# Patient Record
Sex: Female | Born: 2000 | Race: Asian | Hispanic: No | Marital: Single | Smoking: Never smoker
Health system: Southern US, Community
[De-identification: ages and names within clinical notes are randomized; demographics above are authoritative.]

## PROBLEM LIST (undated history)

## (undated) DIAGNOSIS — N200 Calculus of kidney: Secondary | ICD-10-CM

---
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
## 2021-01-03 ENCOUNTER — Telehealth: Payer: Self-pay

## 2021-01-03 ENCOUNTER — Other Ambulatory Visit: Payer: Self-pay | Admitting: Obstetrics and Gynecology

## 2021-01-03 DIAGNOSIS — R7989 Other specified abnormal findings of blood chemistry: Secondary | ICD-10-CM

## 2021-01-03 NOTE — Telephone Encounter (Addendum)
-----   Message from [place] Bing, MD sent at 01/03/2021 12:32 PM EST ----- Can y'all contact her and let her know that it looks like the lab couldn't add on a free t4 to her tsh and ask her to come in for a re-draw? I've put in the orders. Thanks  Called pt with [employer] # and left message requesting for pt to come in for a lab draw if she could please give the office a call.  Pt lab drawn on 01/05/21.  Leonette Nutting  01/03/21

## 2021-01-05 ENCOUNTER — Other Ambulatory Visit: Payer: Self-pay

## 2021-01-05 ENCOUNTER — Other Ambulatory Visit: Payer: Medicaid Other

## 2021-01-05 DIAGNOSIS — R7989 Other specified abnormal findings of blood chemistry: Secondary | ICD-10-CM

## 2021-01-06 LAB — T4, FREE: Free T4: 1.18 ng/dL (ref 0.93–1.60)

## 2021-01-06 LAB — TSH: TSH: 2.84 u[IU]/mL (ref 0.450–4.500)

## 2021-01-23 DIAGNOSIS — Z419 Encounter for procedure for purposes other than remedying health state, unspecified: Secondary | ICD-10-CM | POA: Diagnosis not present

## 2021-02-23 DIAGNOSIS — Z419 Encounter for procedure for purposes other than remedying health state, unspecified: Secondary | ICD-10-CM | POA: Diagnosis not present

## 2021-03-17 ENCOUNTER — Ambulatory Visit (INDEPENDENT_AMBULATORY_CARE_PROVIDER_SITE_OTHER): Payer: Medicaid Other | Admitting: Internal Medicine

## 2021-03-17 ENCOUNTER — Encounter: Payer: Self-pay | Admitting: Internal Medicine

## 2021-03-17 VITALS — BP 111/68 | HR 87 | Temp 98.1°F | Wt 163.1 lb

## 2021-03-17 DIAGNOSIS — R1013 Epigastric pain: Secondary | ICD-10-CM | POA: Diagnosis not present

## 2021-03-17 HISTORY — DX: Epigastric pain: R10.13

## 2021-03-17 NOTE — Progress Notes (Signed)
° °  CC: abdominal pain  HPI:  [name] is a 21 y.o. female with PMHx as stated below presenting for evaluation of abdominal pain. Please see problem based charting for complete assessment and plan.   Past Medical History:  Diagnosis Date   COVID-19 08/2020   Review of Systems:  Negative except as stated in HPI  Physical Exam:  Vitals:   03/17/21 1502  BP: 111/68  Pulse: 87  Temp: 98.1 F (36.7 C)  TempSrc: Oral  SpO2: 99%  Weight: 163 lb 2.3 oz (74 kg)   Physical Exam  Constitutional: Appears well-developed and well-nourished. No distress.  HENT: Normocephalic and atraumatic, EOMI, conjunctiva normal, moist mucous membranes GI: Nondistended, soft, nontender to palpation, normal bowel sounds Musculoskeletal: Normal bulk and tone.   Skin: Warm and dry.  No rash, erythema, lesions noted. Psychiatric: Normal mood and affect. Behavior is normal. Judgment and thought content normal.   Assessment & Plan:   See Encounters Tab for problem based charting.  Patient discussed with Dr. Oswaldo Done

## 2021-03-17 NOTE — Patient Instructions (Addendum)
Ms Pamela Buckley,  It was a pleasure seeing you in clinic. Today we discussed:   Dyspepsia:  We are testing for H. Pylori at this time. I will call you with any abnormal results and further recommendations.   Irregular menses: Follow up with the OB/GYN at [facility] for [facility] [phone]).   If you have any questions or concerns, please call our clinic at [phone] between 9am-5pm and after hours call [phone] and ask for the internal medicine resident on call. If you feel you are having a medical emergency please call 911.   Thank you, we look forward to helping you remain healthy!

## 2021-03-17 NOTE — Assessment & Plan Note (Addendum)
Patient is presenting with two months of diffuse cramping abdominal pain that has been persistent every morning on awakening. She reports associated "bad taste" in her mouth as well. This is relieved with defecation. She denies any diarrhea or constipation and reports that her stools are non-melanotic and without hematochezia. She has had intermittent nausea but no vomiting. Reports that her sister and cousin have had similar symptoms. She has a history of H pylori infection that was treated with triple therapy in 2022 with improvement in symptoms and repeat H.pylori was negative.  Plan: Repeat H. Pylori testing If negative, can continue with supportive care for dsyspepsia   ADDENDUM: H.pylori negative. Discussed with patient who still reports ongoing symptoms. Will trial 8 week of omeprazole 20mg  daily. If no significant improvement noted, will pursue further work up

## 2021-03-18 NOTE — Progress Notes (Signed)
Internal Medicine Clinic Attending ° °Case discussed with Dr. Aslam  At the time of the visit.  We reviewed the resident’s history and exam and pertinent patient test results.  I agree with the assessment, diagnosis, and plan of care documented in the resident’s note.  °

## 2021-03-19 LAB — H. PYLORI ANTIGEN, STOOL: H pylori Ag, Stl: NEGATIVE

## 2021-03-21 ENCOUNTER — Telehealth: Payer: Self-pay

## 2021-03-21 MED ORDER — OMEPRAZOLE MAGNESIUM 20 MG PO TBEC: 20.0000 mg | DELAYED_RELEASE_TABLET | Freq: Every day | ORAL | 0 refills | Status: DC

## 2021-03-21 NOTE — Telephone Encounter (Signed)
PA  for pt ( OMEPRAZOLE 20 MG DR TAB ) came through on cover my meds was submitted with office notes and labs .. awaiting approval or denial

## 2021-03-21 NOTE — Addendum Note (Signed)
Addended by: Eliezer Bottom on: 03/21/2021 09:12 AM   Modules accepted: Orders

## 2021-03-21 NOTE — Telephone Encounter (Signed)
DECISION :     Denied today    Denied. The drug that you asked for is not covered.   We are denying your request because we do not show that you have tried at least 2 preferred drugs.   Other covered drugs are: esomeprazole magnesium capsule (generic for Nexium Rx), esomeprazole magnesium tablet OTC (generic for Nexium OTC), lansoprazole capsule (generic for Prevacid Rx), Nexium Rx Packet, omeprazole Rx capsule (generic for Prilosec Rx), pantoprazole tablet (generic for Protonix), Protonix Suspension. Note: Some covered drug(s) may have limits on the quantity you can get. You can get this information on the list of covered drugs (Preferred Drug List) for details.    ( COPY PLACED TO BE SCANNED TO CHART AND DR. NOTIFIED )

## 2021-03-23 DIAGNOSIS — Z419 Encounter for procedure for purposes other than remedying health state, unspecified: Secondary | ICD-10-CM | POA: Diagnosis not present

## 2021-04-23 DIAGNOSIS — Z419 Encounter for procedure for purposes other than remedying health state, unspecified: Secondary | ICD-10-CM | POA: Diagnosis not present

## 2021-05-16 DIAGNOSIS — M25562 Pain in left knee: Secondary | ICD-10-CM | POA: Diagnosis not present

## 2021-05-16 DIAGNOSIS — S8002XA Contusion of left knee, initial encounter: Secondary | ICD-10-CM | POA: Diagnosis not present

## 2021-05-23 DIAGNOSIS — Z419 Encounter for procedure for purposes other than remedying health state, unspecified: Secondary | ICD-10-CM | POA: Diagnosis not present

## 2021-06-02 DIAGNOSIS — L209 Atopic dermatitis, unspecified: Secondary | ICD-10-CM | POA: Diagnosis not present

## 2021-06-10 ENCOUNTER — Encounter: Payer: Self-pay | Admitting: Obstetrics and Gynecology

## 2021-06-10 ENCOUNTER — Encounter: Payer: Self-pay | Admitting: Family Medicine

## 2021-06-23 DIAGNOSIS — Z419 Encounter for procedure for purposes other than remedying health state, unspecified: Secondary | ICD-10-CM | POA: Diagnosis not present

## 2021-06-29 ENCOUNTER — Ambulatory Visit: Payer: Medicaid Other | Admitting: Obstetrics and Gynecology

## 2021-07-22 ENCOUNTER — Ambulatory Visit (INDEPENDENT_AMBULATORY_CARE_PROVIDER_SITE_OTHER): Payer: Medicaid Other | Admitting: Medical

## 2021-07-22 ENCOUNTER — Encounter: Payer: Self-pay | Admitting: Medical

## 2021-07-22 ENCOUNTER — Other Ambulatory Visit: Payer: Self-pay

## 2021-07-22 VITALS — BP 105/79 | HR 75 | Ht 62.0 in | Wt 169.1 lb

## 2021-07-22 DIAGNOSIS — R438 Other disturbances of smell and taste: Secondary | ICD-10-CM

## 2021-07-22 DIAGNOSIS — N926 Irregular menstruation, unspecified: Secondary | ICD-10-CM

## 2021-07-22 LAB — CBC
Hematocrit: 40.5 % (ref 34.0–46.6)
Hemoglobin: 13.7 g/dL (ref 11.1–15.9)
MCH: 29.8 pg (ref 26.6–33.0)
MCHC: 33.8 g/dL (ref 31.5–35.7)
MCV: 88 fL (ref 79–97)
Platelets: 349 10*3/uL (ref 150–450)
RBC: 4.59 x10E6/uL (ref 3.77–5.28)
RDW: 12.4 % (ref 11.7–15.4)
WBC: 6.1 10*3/uL (ref 3.4–10.8)

## 2021-07-22 MED ORDER — NORGESTIMATE-ETH ESTRADIOL 0.25-35 MG-MCG PO TABS
1.0000 | ORAL_TABLET | Freq: Every day | ORAL | 11 refills | Status: DC
Start: 2021-07-22 — End: 2023-09-07

## 2021-07-22 NOTE — Progress Notes (Signed)
   History:  Ms. Pamela Buckley is a 21 y.o. G0P0000 who presents to clinic today for irregular periods. The patient was seen previously by Dr. Vergie Living. Labs were grossly normal. She was given Rx for Provera to induce bleeding. Patient states she took as directed x 14 days and didn't have bleeding for 2 months. She has only had one other period since November that started 2 days ago. She has some cramping with her period and it usually only lasts 3-4 days. She is concerned about upper abdominal pain and a metallic taste in her mouth recently as well.    The following portions of the patient's history were reviewed and updated as appropriate: allergies, current medications, family history, past medical history, social history, past surgical history and problem list.  Review of Systems:  Review of Systems  Constitutional:  Negative for fever and malaise/fatigue.  Gastrointestinal:  Positive for abdominal pain. Negative for constipation, diarrhea, nausea and vomiting.  Genitourinary:        + vaginal bleeding, pelvic pain      Objective:  Physical Exam BP 105/79   Pulse 75   Ht 5\' 2"  (1.575 m)   Wt 169 lb 1.6 oz (76.7 kg)   LMP 07/21/2021 (Exact Date)   BMI 30.93 kg/m  Physical Exam Vitals reviewed.  Constitutional:      General: She is not in acute distress.    Appearance: Normal appearance. She is obese.  HENT:     Head: Normocephalic.  Cardiovascular:     Rate and Rhythm: Normal rate.  Pulmonary:     Effort: Pulmonary effort is normal.  Abdominal:     General: Abdomen is flat. There is no distension.  Skin:    General: Skin is warm and dry.     Coloration: Skin is not pale.  Neurological:     Mental Status: She is alert and oriented to person, place, and time.  Psychiatric:        Mood and Affect: Mood normal.     Health Maintenance Due  Topic Date Due   HPV VACCINES (1 - 2-dose series) Never done   HIV Screening  Never done   Hepatitis C Screening  Never done    COVID-19 Vaccine (3 - Pfizer series) 02/05/2020    Labs, imaging and notes from previous visits reviewed   Assessment & Plan:  1. Irregular periods - CBC - Discussed since Provera failed to provide regular withdrawal bleed that we need to consider other options for protecting the endometrium - Discussed options for OCPs, Depo, Nexplanon, IUD and patient would like to trial OCPs first  - norgestimate-ethinyl estradiol (ORTHO-CYCLEN) 0.25-35 MG-MCG tablet; Take 1 tablet by mouth daily.  Dispense: 28 tablet; Refill: 11 - Patient to return to [facility] in 6 months for follow-up of irregular periods   2. Metallic taste - Ambulatory referral to Gastroenterology  Approximately 27 minutes of total time was spent with this patient on history taking & patient education with a Dari interpreter as well as chart review, coordination of care and documentation.   10-09-1997, PA-C 07/22/2021 12:14 PM

## 2021-07-23 DIAGNOSIS — Z419 Encounter for procedure for purposes other than remedying health state, unspecified: Secondary | ICD-10-CM | POA: Diagnosis not present

## 2021-08-23 DIAGNOSIS — Z419 Encounter for procedure for purposes other than remedying health state, unspecified: Secondary | ICD-10-CM | POA: Diagnosis not present

## 2021-09-08 DIAGNOSIS — Z6828 Body mass index (BMI) 28.0-28.9, adult: Secondary | ICD-10-CM | POA: Diagnosis not present

## 2021-09-08 DIAGNOSIS — M545 Low back pain, unspecified: Secondary | ICD-10-CM | POA: Diagnosis not present

## 2021-09-08 DIAGNOSIS — M129 Arthropathy, unspecified: Secondary | ICD-10-CM | POA: Diagnosis not present

## 2021-09-08 DIAGNOSIS — Z0000 Encounter for general adult medical examination without abnormal findings: Secondary | ICD-10-CM | POA: Diagnosis not present

## 2021-09-08 DIAGNOSIS — E559 Vitamin D deficiency, unspecified: Secondary | ICD-10-CM | POA: Diagnosis not present

## 2021-09-08 DIAGNOSIS — R5383 Other fatigue: Secondary | ICD-10-CM | POA: Diagnosis not present

## 2021-09-23 DIAGNOSIS — Z419 Encounter for procedure for purposes other than remedying health state, unspecified: Secondary | ICD-10-CM | POA: Diagnosis not present

## 2021-10-05 ENCOUNTER — Encounter: Payer: Medicaid Other | Admitting: Internal Medicine

## 2021-10-23 DIAGNOSIS — Z419 Encounter for procedure for purposes other than remedying health state, unspecified: Secondary | ICD-10-CM | POA: Diagnosis not present

## 2021-11-23 DIAGNOSIS — Z419 Encounter for procedure for purposes other than remedying health state, unspecified: Secondary | ICD-10-CM | POA: Diagnosis not present

## 2021-12-23 DIAGNOSIS — Z419 Encounter for procedure for purposes other than remedying health state, unspecified: Secondary | ICD-10-CM | POA: Diagnosis not present

## 2021-12-28 DIAGNOSIS — H04123 Dry eye syndrome of bilateral lacrimal glands: Secondary | ICD-10-CM | POA: Diagnosis not present

## 2022-01-03 ENCOUNTER — Ambulatory Visit: Payer: Medicaid Other | Admitting: Medical

## 2022-01-05 ENCOUNTER — Encounter: Payer: Self-pay | Admitting: Advanced Practice Midwife

## 2022-01-05 ENCOUNTER — Ambulatory Visit (INDEPENDENT_AMBULATORY_CARE_PROVIDER_SITE_OTHER): Payer: Medicaid Other | Admitting: Advanced Practice Midwife

## 2022-01-05 VITALS — BP 125/88 | HR 76 | Wt 175.3 lb

## 2022-01-05 DIAGNOSIS — E038 Other specified hypothyroidism: Secondary | ICD-10-CM | POA: Diagnosis not present

## 2022-01-05 DIAGNOSIS — N926 Irregular menstruation, unspecified: Secondary | ICD-10-CM

## 2022-01-05 MED ORDER — LEVONORGESTREL-ETHINYL ESTRAD 0.1-20 MG-MCG PO TABS: 1.0000 | ORAL_TABLET | Freq: Every day | ORAL | 11 refills | Status: DC

## 2022-01-05 NOTE — Progress Notes (Signed)
  Subjective:     Patient ID: Pamela Buckley, female   DOB: [birth year], 21 y.o.   MRN: [id number]  Pamela Buckley is a 21 y.o. G0P0000 who presents today for FU on irregular menstrual cycles. She was given OCPs at her last visit here to see if that would help regulate her cycles. She states that she took them for one week, and then stopped. She states that she stopped because she was having side effects including nausea and abdominal pain.   Menstrual cycles since last visit  8/22 x 2-3 days with normal flow, denies clots, some pain for a about 5 hours on fist day  11/03 x 2-3 days with normal flow, denies clots, some pain  Patient states that she has was on medication in [place] country that worked well for her, but she does not recall that medication was called. Patient states that she would like to get an ultrasound so that she can give the results to her doctor back in her [place].      Review of Systems  All other systems reviewed and are negative.     Objective:   Physical Exam Vitals and nursing note reviewed.  Constitutional:      General: She is not in acute distress. HENT:     Head: Normocephalic.  Cardiovascular:     Rate and Rhythm: Normal rate.  Pulmonary:     Effort: Pulmonary effort is normal.  Neurological:     General: No focal deficit present.     Mental Status: She is alert and oriented to person, place, and time.  Psychiatric:        Mood and Affect: Mood normal.        Behavior: Behavior normal.        Assessment:     1. Irregular menses   2. Subclinical hypothyroidism        Plan:     TSH T4 TPO antibodies  Pelvic US ordered  RX: new OCP with lower dose to see if she can tolerate side effects better   Dari interpretor here for the entire visit   FU with PCP      Thressa Sheller DNP, CNM  01/05/22  4:04 PM

## 2022-01-06 LAB — THYROID PEROXIDASE ANTIBODY: Thyroperoxidase Ab SerPl-aCnc: 19 IU/mL (ref 0–34)

## 2022-01-06 LAB — T4, FREE: Free T4: 1.29 ng/dL (ref 0.82–1.77)

## 2022-01-06 LAB — TSH: TSH: 1.26 u[IU]/mL (ref 0.450–4.500)

## 2022-01-18 ENCOUNTER — Inpatient Hospital Stay: Admission: RE | Admit: 2022-01-18 | Payer: Medicaid Other | Source: Ambulatory Visit

## 2022-01-23 DIAGNOSIS — Z419 Encounter for procedure for purposes other than remedying health state, unspecified: Secondary | ICD-10-CM | POA: Diagnosis not present

## 2022-01-25 ENCOUNTER — Ambulatory Visit (HOSPITAL_COMMUNITY): Payer: Medicaid Other

## 2022-01-30 ENCOUNTER — Other Ambulatory Visit: Payer: Self-pay | Admitting: Advanced Practice Midwife

## 2022-01-31 ENCOUNTER — Other Ambulatory Visit: Payer: Self-pay | Admitting: Advanced Practice Midwife

## 2022-01-31 ENCOUNTER — Ambulatory Visit (HOSPITAL_COMMUNITY)
Admission: RE | Admit: 2022-01-31 | Discharge: 2022-01-31 | Disposition: A | Discharge status: Home / Self Care | Payer: Medicaid Other | Source: Ambulatory Visit | Attending: Advanced Practice Midwife | Admitting: Advanced Practice Midwife

## 2022-01-31 DIAGNOSIS — N926 Irregular menstruation, unspecified: Secondary | ICD-10-CM | POA: Diagnosis not present

## 2022-01-31 DIAGNOSIS — R1909 Other intra-abdominal and pelvic swelling, mass and lump: Secondary | ICD-10-CM | POA: Diagnosis not present

## 2022-01-31 DIAGNOSIS — E038 Other specified hypothyroidism: Secondary | ICD-10-CM

## 2022-02-23 DIAGNOSIS — Z419 Encounter for procedure for purposes other than remedying health state, unspecified: Secondary | ICD-10-CM | POA: Diagnosis not present

## 2022-03-24 DIAGNOSIS — Z419 Encounter for procedure for purposes other than remedying health state, unspecified: Secondary | ICD-10-CM | POA: Diagnosis not present

## 2022-04-24 DIAGNOSIS — Z419 Encounter for procedure for purposes other than remedying health state, unspecified: Secondary | ICD-10-CM | POA: Diagnosis not present

## 2022-05-24 DIAGNOSIS — Z419 Encounter for procedure for purposes other than remedying health state, unspecified: Secondary | ICD-10-CM | POA: Diagnosis not present

## 2022-06-12 IMAGING — US US ABDOMEN LIMITED RUQ/ASCITES
1 series · 14 of 25 positions shown · non-contrast
Comparison: None.

CLINICAL DATA: 19-year-old female with right upper quadrant
abdominal pain.

EXAM:
ULTRASOUND ABDOMEN LIMITED RIGHT UPPER QUADRANT

[Series 1: us abdomen limited ruq (liver/gb) · 14 of 51 slices shown]
[im 1/51]
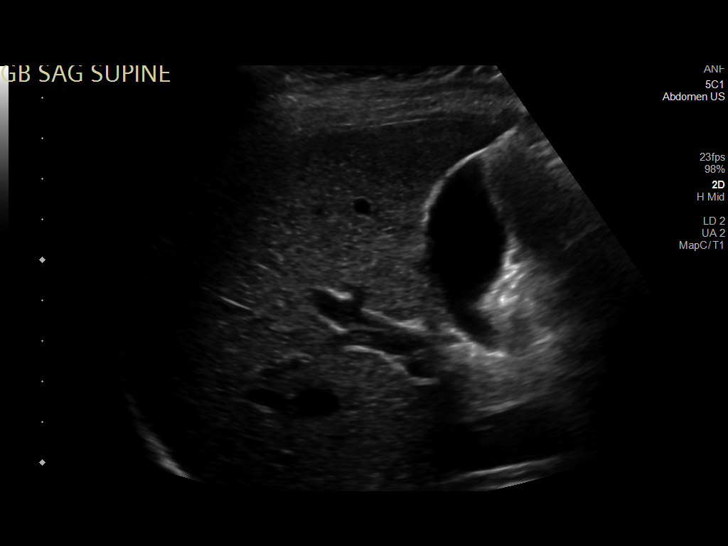
[im 5/51]
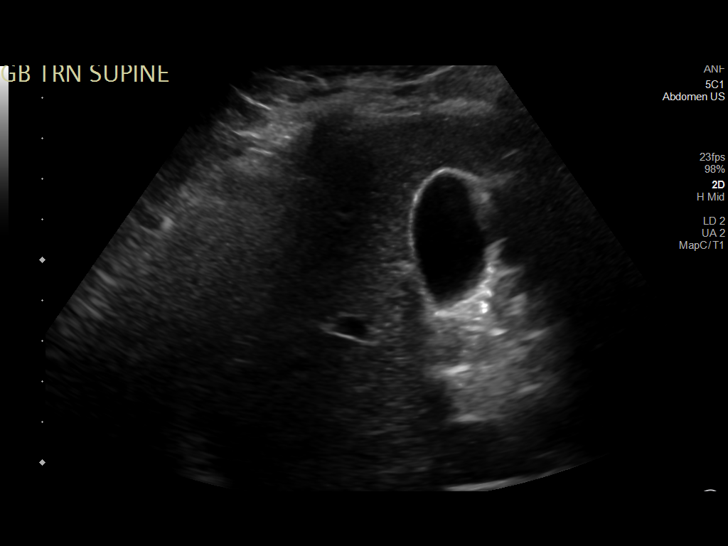
[im 9/51]
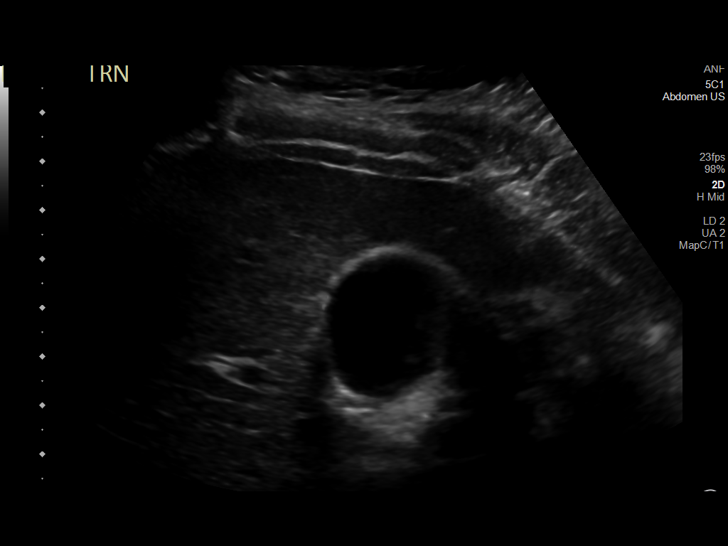
[im 13/51]
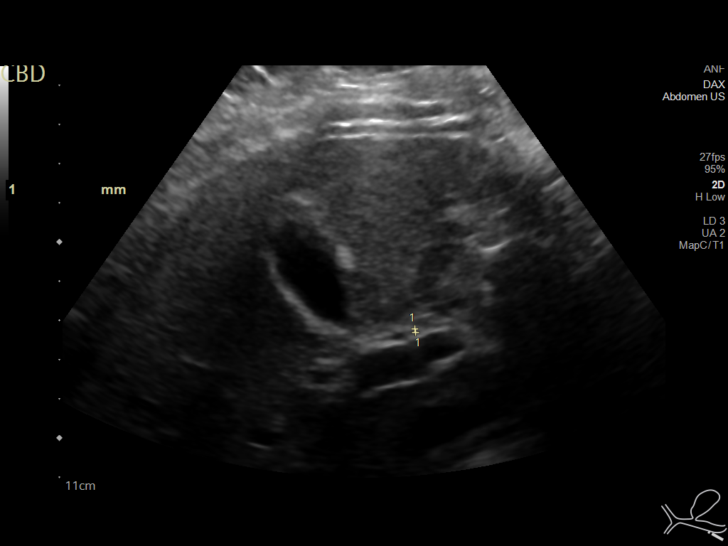
[im 17/51]
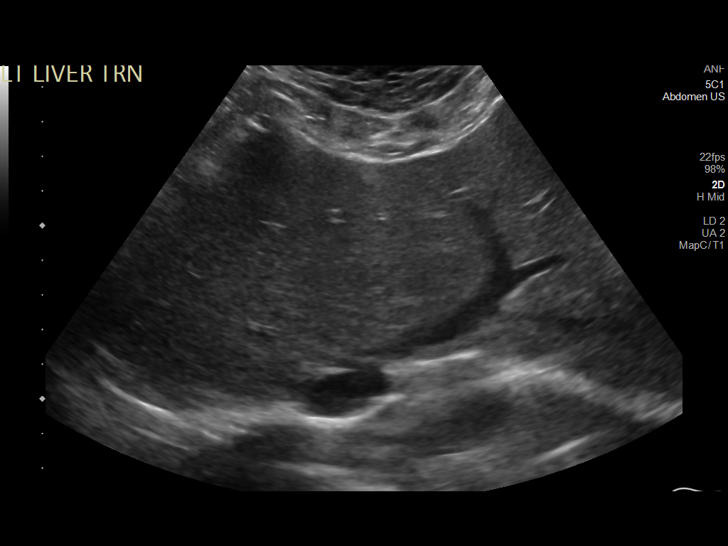
[im 19/51]
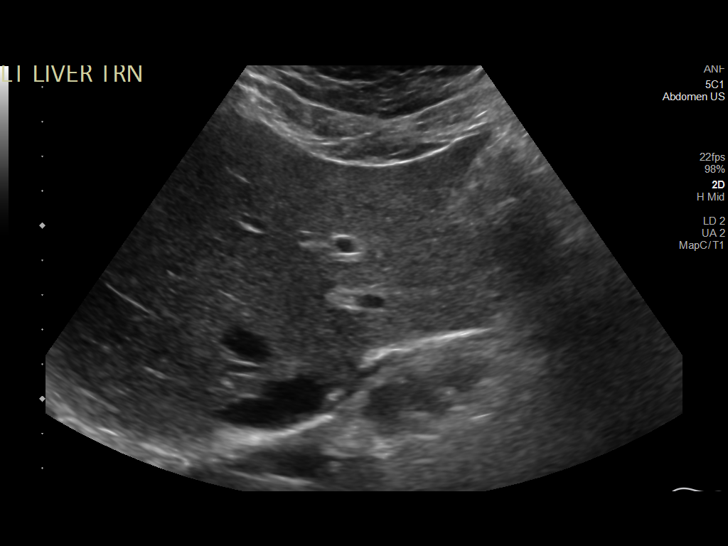
[im 23/51]
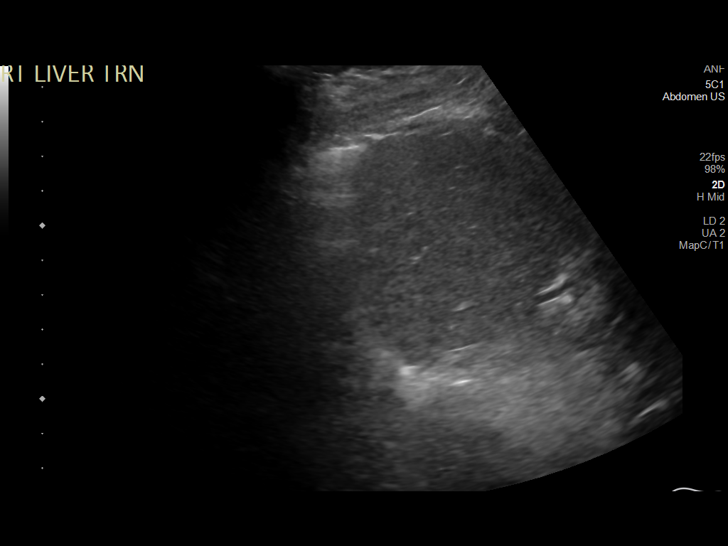
[im 28/51]
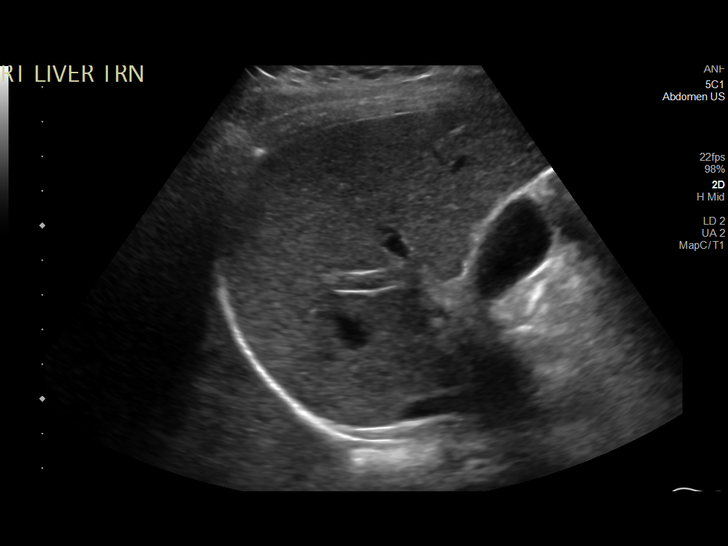
[im 32/51]
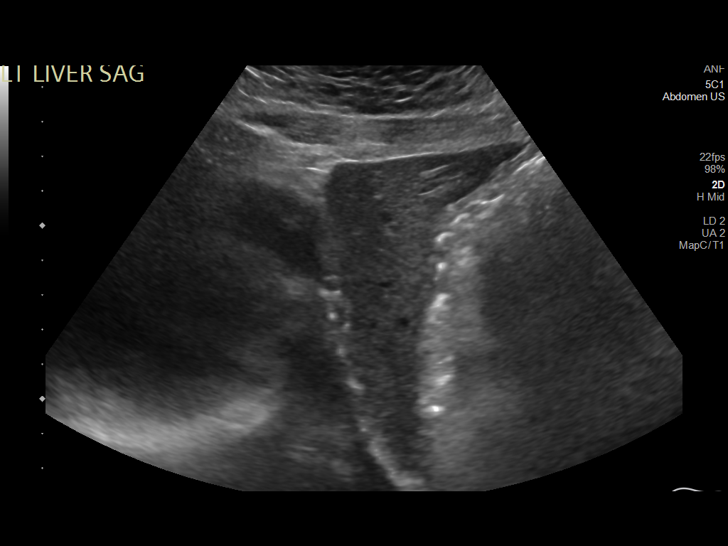
[im 34/51]
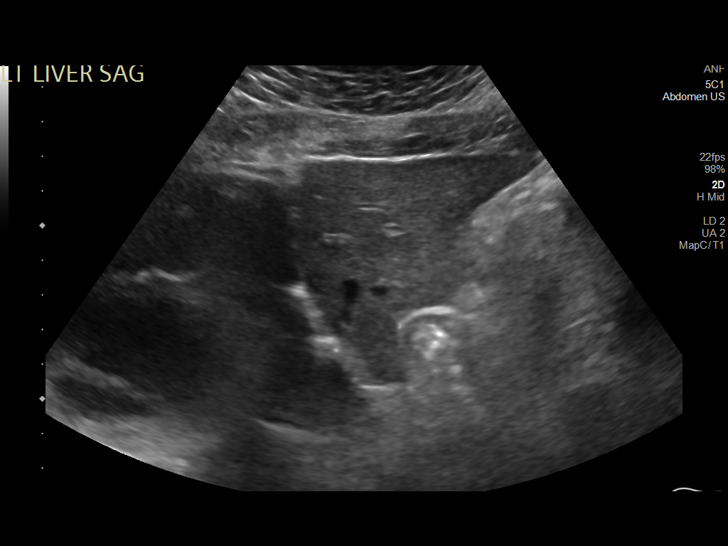
[im 38/51]
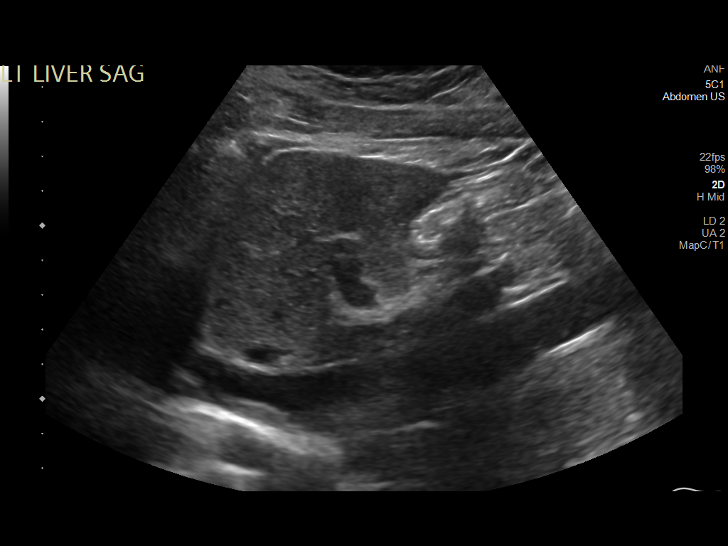
[im 42/51]
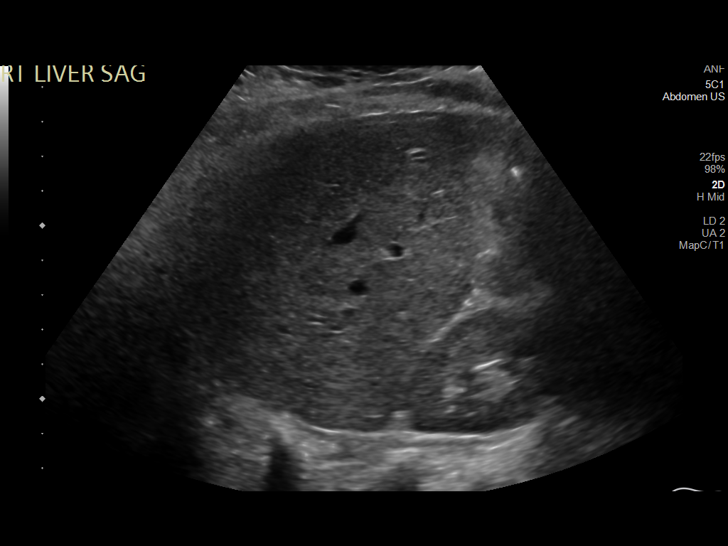
[im 46/51]
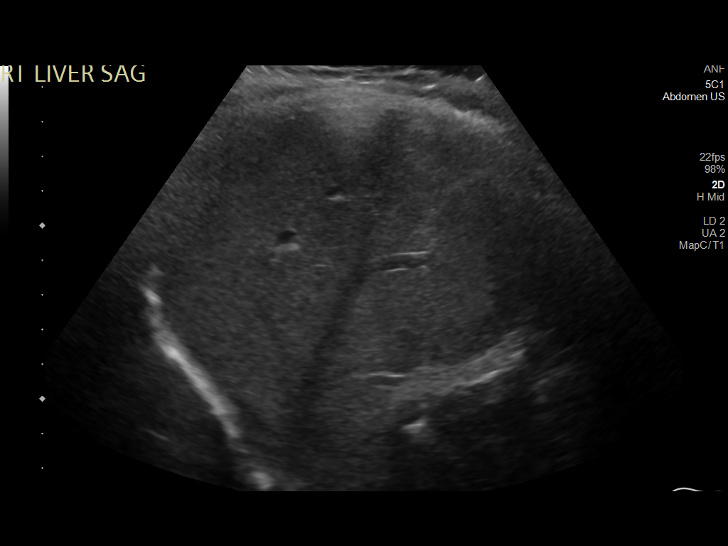
[im 51/51]
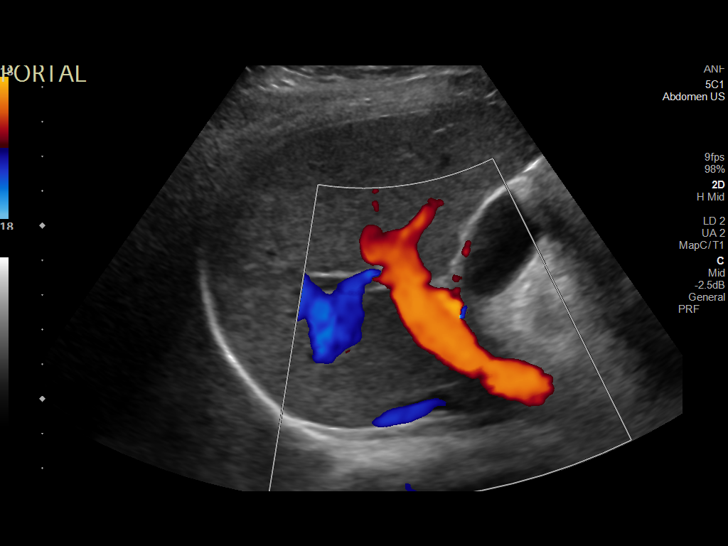

[14 of 25 positions shown; findings below may reference images not displayed]

FINDINGS: Gallbladder:

No gallstones or wall thickening visualized. No sonographic Murphy
sign noted by sonographer.

Common bile duct:

Diameter: 1 mm

Liver:

No focal lesion identified. Within normal limits in parenchymal
echogenicity. Portal vein is patent on color Doppler imaging with
normal direction of blood flow towards the liver.

Other: None.
IMPRESSION: Unremarkable right upper quadrant ultrasound.

## 2022-06-24 DIAGNOSIS — Z419 Encounter for procedure for purposes other than remedying health state, unspecified: Secondary | ICD-10-CM | POA: Diagnosis not present

## 2022-07-24 DIAGNOSIS — Z419 Encounter for procedure for purposes other than remedying health state, unspecified: Secondary | ICD-10-CM | POA: Diagnosis not present

## 2022-08-24 DIAGNOSIS — Z419 Encounter for procedure for purposes other than remedying health state, unspecified: Secondary | ICD-10-CM | POA: Diagnosis not present

## 2022-09-23 DIAGNOSIS — R5383 Other fatigue: Secondary | ICD-10-CM | POA: Diagnosis not present

## 2022-09-23 DIAGNOSIS — E6609 Other obesity due to excess calories: Secondary | ICD-10-CM | POA: Diagnosis not present

## 2022-09-23 DIAGNOSIS — Z6830 Body mass index (BMI) 30.0-30.9, adult: Secondary | ICD-10-CM | POA: Diagnosis not present

## 2022-09-23 DIAGNOSIS — E282 Polycystic ovarian syndrome: Secondary | ICD-10-CM | POA: Diagnosis not present

## 2022-09-23 DIAGNOSIS — Z0000 Encounter for general adult medical examination without abnormal findings: Secondary | ICD-10-CM | POA: Diagnosis not present

## 2022-09-26 ENCOUNTER — Other Ambulatory Visit: Payer: Self-pay

## 2022-09-26 ENCOUNTER — Emergency Department (HOSPITAL_BASED_OUTPATIENT_CLINIC_OR_DEPARTMENT_OTHER): Payer: Medicaid Other

## 2022-09-26 ENCOUNTER — Encounter (HOSPITAL_BASED_OUTPATIENT_CLINIC_OR_DEPARTMENT_OTHER): Payer: Self-pay | Admitting: Emergency Medicine

## 2022-09-26 ENCOUNTER — Emergency Department (HOSPITAL_BASED_OUTPATIENT_CLINIC_OR_DEPARTMENT_OTHER)
Admission: EM | Admit: 2022-09-26 | Discharge: 2022-09-26 | Disposition: A | Discharge status: Home / Self Care | Payer: Medicaid Other | Attending: Emergency Medicine | Admitting: Emergency Medicine

## 2022-09-26 DIAGNOSIS — N831 Corpus luteum cyst of ovary, unspecified side: Secondary | ICD-10-CM | POA: Insufficient documentation

## 2022-09-26 DIAGNOSIS — R1031 Right lower quadrant pain: Secondary | ICD-10-CM

## 2022-09-26 DIAGNOSIS — N83201 Unspecified ovarian cyst, right side: Secondary | ICD-10-CM | POA: Diagnosis not present

## 2022-09-26 HISTORY — DX: Calculus of kidney: N20.0

## 2022-09-26 LAB — COMPREHENSIVE METABOLIC PANEL
ALT: 13 U/L (ref 0–44)
AST: 16 U/L (ref 15–41)
Albumin: 3.9 g/dL (ref 3.5–5.0)
Alkaline Phosphatase: 83 U/L (ref 38–126)
Anion gap: 8 (ref 5–15)
BUN: 11 mg/dL (ref 6–20)
CO2: 23 mmol/L (ref 22–32)
Calcium: 8.7 mg/dL — ABNORMAL LOW (ref 8.9–10.3)
Chloride: 105 mmol/L (ref 98–111)
Creatinine, Ser: 0.49 mg/dL (ref 0.44–1.00)
GFR, Estimated: >60 mL/min (ref >60)
Glucose, Bld: 103 mg/dL — ABNORMAL HIGH (ref 70–99)
Potassium: 3.7 mmol/L (ref 3.5–5.1)
Sodium: 136 mmol/L (ref 135–145)
Total Bilirubin: 0.3 mg/dL (ref 0.3–1.2)
Total Protein: 6.9 g/dL (ref 6.5–8.1)

## 2022-09-26 LAB — URINALYSIS, W/ REFLEX TO CULTURE (INFECTION SUSPECTED)
Bilirubin Urine: NEGATIVE
Glucose, UA: NEGATIVE mg/dL
Hgb urine dipstick: NEGATIVE
Ketones, ur: NEGATIVE mg/dL
Leukocytes,Ua: NEGATIVE
Nitrite: NEGATIVE
Protein, ur: NEGATIVE mg/dL
Specific Gravity, Urine: 1.008 (ref 1.005–1.030)
pH: 6.5 (ref 5.0–8.0)

## 2022-09-26 LAB — CBC WITH DIFFERENTIAL/PLATELET
Abs Immature Granulocytes: 0.06 10*3/uL (ref 0.00–0.07)
Basophils Absolute: 0 10*3/uL (ref 0.0–0.1)
Basophils Relative: 1 %
Eosinophils Absolute: 0.1 10*3/uL (ref 0.0–0.5)
Eosinophils Relative: 1 %
HCT: 38 % (ref 36.0–46.0)
Hemoglobin: 12.7 g/dL (ref 12.0–15.0)
Immature Granulocytes: 1 %
Lymphocytes Relative: 35 %
Lymphs Abs: 2.5 10*3/uL (ref 0.7–4.0)
MCH: 29.1 pg (ref 26.0–34.0)
MCHC: 33.4 g/dL (ref 30.0–36.0)
MCV: 87.2 fL (ref 80.0–100.0)
Monocytes Absolute: 0.5 10*3/uL (ref 0.1–1.0)
Monocytes Relative: 7 %
Neutro Abs: 3.9 10*3/uL (ref 1.7–7.7)
Neutrophils Relative %: 55 %
Platelets: 324 10*3/uL (ref 150–400)
RBC: 4.36 MIL/uL (ref 3.87–5.11)
RDW: 13 % (ref 11.5–15.5)
WBC: 7 10*3/uL (ref 4.0–10.5)
nRBC: 0 % (ref 0.0–0.2)

## 2022-09-26 LAB — PREGNANCY, URINE: Preg Test, Ur: NEGATIVE

## 2022-09-26 MED ORDER — FENTANYL CITRATE PF 50 MCG/ML IJ SOSY
50.0000 ug | PREFILLED_SYRINGE | INTRAMUSCULAR | Status: DC | PRN
  Administered 2022-09-26: 50 ug via INTRAVENOUS
  Filled 2022-09-26: qty 1

## 2022-09-26 MED ORDER — FENTANYL CITRATE PF 50 MCG/ML IJ SOSY: 50.0000 ug | PREFILLED_SYRINGE | INTRAMUSCULAR | Status: DC | PRN

## 2022-09-26 MED ORDER — ACETAMINOPHEN 325 MG PO TABS
650.0000 mg | ORAL_TABLET | Freq: Once | ORAL | Status: AC
  Administered 2022-09-26: 650 mg via ORAL
  Filled 2022-09-26: qty 2

## 2022-09-26 MED ORDER — IOHEXOL 350 MG/ML SOLN
100.0000 mL | Freq: Once | INTRAVENOUS | Status: AC | PRN
  Administered 2022-09-26: 80 mL via INTRAVENOUS

## 2022-09-26 MED ORDER — KETOROLAC TROMETHAMINE 15 MG/ML IJ SOLN
15.0000 mg | Freq: Once | INTRAMUSCULAR | Status: AC
  Administered 2022-09-26: 15 mg via INTRAVENOUS
  Filled 2022-09-26: qty 1

## 2022-09-26 MED ORDER — LACTATED RINGERS IV BOLUS
1000.0000 mL | Freq: Once | INTRAVENOUS | Status: AC
  Administered 2022-09-26: 1000 mL via INTRAVENOUS

## 2022-09-26 MED ORDER — ONDANSETRON HCL 4 MG/2ML IJ SOLN
4.0000 mg | Freq: Three times a day (TID) | INTRAMUSCULAR | Status: DC | PRN
  Administered 2022-09-26: 4 mg via INTRAVENOUS
  Filled 2022-09-26: qty 2

## 2022-09-26 NOTE — ED Notes (Signed)
Patient transported to Ultrasound 

## 2022-09-26 NOTE — Discharge Instructions (Addendum)
In the emergency department for your abdominal pain.  Your workup showed no signs of kidney stones, infection, twisting of your ovaries or appendicitis.  Your CAT scan did show that you have what is called a corpus luteal cyst that tends to occur around ovulation during her menstrual cycle and some people experience pain related to this.  You can continue to take Tylenol or Motrin as needed for pain and both can be taken up to every 6 hours.  You can follow-up with your primary doctor or your GYN to have your symptoms rechecked.  You should return to the emergency department for significantly worsening pain, fevers, repetitive vomiting or any other new or concerning symptoms.

## 2022-09-26 NOTE — ED Provider Notes (Signed)
[place] EMERGENCY DEPARTMENT AT [facility] Provider Note   CSN: [id number] Arrival date & time: 09/26/22  4098     History  Chief Complaint  Patient presents with   Flank Pain    Pamela Buckley is a 22 y.o. female.   Flank Pain     22 year old female presenting to the emergency department with a chief complaint of abdominal pain.  The patient states that earlier this morning she had sudden onset right lower quadrant abdominal pain that is since now radiated to her bilateral lower abdomen.  She denies any vaginal bleeding or vaginal discharge.  She has not been sexually active and is not concerned for sexually transmitted infections and is not concerned for pregnancy.  She denies any fevers or chills.  She is tolerating oral intake.  She endorses mild nausea.  She does have a history of remote kidney stones and feels that this could be consistent with a prior kidney stone however her pain is now bilateral.  She denies any genitourinary symptoms.  She has no concern for STIs.  Home Medications Prior to Admission medications   Medication Sig Start Date End Date Taking? Authorizing Provider  levonorgestrel-ethinyl estradiol (ALESSE) 0.1-20 MG-MCG tablet Take 1 tablet by mouth daily. 01/05/22   Thressa Sheller D, CNM  medroxyPROGESTERone (PROVERA) 10 MG tablet 1 tab po 14d a month. You should get a period. Repeat qmonth. Patient not taking: Reported on 07/22/2021 12/13/20   [place] Bing, MD  norgestimate-ethinyl estradiol (ORTHO-CYCLEN) 0.25-35 MG-MCG tablet Take 1 tablet by mouth daily. Patient not taking: Reported on 01/05/2022 07/22/21   Marny Lowenstein, PA-C      Allergies    Patient has no known allergies.    Review of Systems   Review of Systems  Genitourinary:  Positive for flank pain.  All other systems reviewed and are negative.   Physical Exam Updated Vital Signs BP 92/64   Pulse 66   Temp 98.4 F (36.9 C) (Oral)   Resp 16   Ht 5\' 2"  (1.575 m)   Wt  81.2 kg   LMP 07/04/2022 (Approximate) Comment: Irregular  SpO2 99%   BMI 32.74 kg/m  Physical Exam Vitals and nursing note reviewed.  Constitutional:      General: She is not in acute distress.    Appearance: She is well-developed.  HENT:     Head: Normocephalic and atraumatic.  Eyes:     Conjunctiva/sclera: Conjunctivae normal.  Cardiovascular:     Rate and Rhythm: Normal rate and regular rhythm.  Pulmonary:     Effort: Pulmonary effort is normal. No respiratory distress.     Breath sounds: Normal breath sounds.  Abdominal:     Palpations: Abdomen is soft.     Tenderness: There is abdominal tenderness in the right lower quadrant, suprapubic area and left lower quadrant. There is no right CVA tenderness, left CVA tenderness or guarding.  Musculoskeletal:        General: No swelling.     Cervical back: Neck supple.  Skin:    General: Skin is warm and dry.     Capillary Refill: Capillary refill takes less than 2 seconds.  Neurological:     Mental Status: She is alert.  Psychiatric:        Mood and Affect: Mood normal.     ED Results / Procedures / Treatments   Labs (all labs ordered are listed, but only abnormal results are displayed) Labs Reviewed  COMPREHENSIVE METABOLIC PANEL - Abnormal; Notable  for the following components:      Result Value   Glucose, Bld 103 (*)    Calcium 8.7 (*)    All other components within normal limits  URINALYSIS, W/ REFLEX TO CULTURE (INFECTION SUSPECTED) - Abnormal; Notable for the following components:   Bacteria, UA FEW (*)    All other components within normal limits  CBC WITH DIFFERENTIAL/PLATELET  PREGNANCY, URINE    EKG None  Radiology CT ABDOMEN PELVIS W CONTRAST  Result Date: 09/26/2022 CLINICAL DATA:  Bilateral lower abdominal pain that started in the right lower quadrant. Right flank pain. Kidney stone 3 years ago. EXAM: CT ABDOMEN AND PELVIS WITH CONTRAST TECHNIQUE: Multidetector CT imaging of the abdomen and pelvis was  performed using the standard protocol following bolus administration of intravenous contrast. RADIATION DOSE REDUCTION: This exam was performed according to the departmental dose-optimization program which includes automated exposure control, adjustment of the mA and/or kV according to patient size and/or use of iterative reconstruction technique. CONTRAST:  80mL OMNIPAQUE IOHEXOL 350 MG/ML SOLN COMPARISON:  Pelvic ultrasound 09/26/2022 abdominal ultrasound 04/14/2020 FINDINGS: Lower chest: No acute abnormality. Hepatobiliary: Unremarkable liver. Normal gallbladder. No biliary dilation. Pancreas: Unremarkable. Spleen: Unremarkable. Adrenals/Urinary Tract: Normal adrenal glands. No urinary calculi or hydronephrosis. Bladder is unremarkable. Stomach/Bowel: Normal caliber large and small bowel. No bowel wall thickening. The appendix is not visualized. No secondary signs of appendicitis.Stomach is within normal limits. Vascular/Lymphatic: No significant vascular findings are present. No enlarged abdominal or pelvic lymph nodes. Reproductive: Uterus and left ovary are unremarkable. Involuting corpus luteum cyst in the right ovary measuring 2.7 cm. No follow-up recommended. Small amount of free fluid in the right adnexa. Other: No free intraperitoneal fluid or air. Musculoskeletal: No acute fracture. IMPRESSION: 1. Involuting corpus luteum cyst in the right ovary measuring 2.7 cm. Small amount of free fluid in the right adnexa. Electronically Signed   By: Minerva Fester M.D.   On: 09/26/2022 17:22   US PELVIC TRANSABD W/PELVIC DOPPLER  Result Date: 09/26/2022 CLINICAL DATA:  Pelvic pain EXAM: TRANSABDOMINAL ULTRASOUND OF PELVIS DOPPLER ULTRASOUND OF OVARIES TECHNIQUE: Transabdominal ultrasound examination of the pelvis was performed including evaluation of the uterus, ovaries, adnexal regions, and pelvic cul-de-sac. Color and duplex Doppler ultrasound was utilized to evaluate blood flow to the ovaries. COMPARISON:   Ultrasound 01/31/2022 FINDINGS: Uterus Measurements: 7.8 x 2.9 x 3.7 cm = volume: 42.9 mL. No fibroids or other mass visualized. Endometrium Thickness: 9 mm.  No focal abnormality visualized. Right ovary Measurements: 4.2 x 3.0 x 4.1 cm = volume: 27.0 mL. Normal appearance/no adnexal mass. Left ovary Measurements: 3.7 x 1.6 x 2.4 cm = volume: 7.3 mL. Normal appearance/no adnexal mass. Pulsed Doppler evaluation demonstrates normal low-resistance arterial and venous waveforms in both ovaries. Other: No free fluid in the pelvis. IMPRESSION: Unremarkable transabdominal pelvic ultrasound Electronically Signed   By: Karen Kays M.D.   On: 09/26/2022 15:25    Procedures Procedures    Medications Ordered in ED Medications  ondansetron (ZOFRAN) injection 4 mg (4 mg Intravenous Given 09/26/22 1009)  fentaNYL (SUBLIMAZE) injection 50 mcg (50 mcg Intravenous Given 09/26/22 1010)  fentaNYL (SUBLIMAZE) injection 50 mcg (has no administration in time range)  lactated ringers bolus 1,000 mL (0 mLs Intravenous Stopped 09/26/22 1126)  acetaminophen (TYLENOL) tablet 650 mg (650 mg Oral Given 09/26/22 1556)  ketorolac (TORADOL) 15 MG/ML injection 15 mg (15 mg Intravenous Given 09/26/22 1554)  iohexol (OMNIPAQUE) 350 MG/ML injection 100 mL (80 mLs Intravenous Contrast Given 09/26/22 1537)  ED Course/ Medical Decision Making/ A&P Clinical Course as of 09/26/22 1752  Tue Sep 26, 2022  1733 Corpus luteal cyst on CTAP, no other acute abnormality on CT to explain symptoms. [VK]  1737 Patient is well appearing and is stable for discharge home with outpatient follow up. [VK]    Clinical Course User Index [VK] Rexford Maus, DO                                 Medical Decision Making Amount and/or Complexity of Data Reviewed Labs: ordered. Radiology: ordered.  Risk OTC drugs. Prescription drug management.    22 year old female presenting to the emergency department with a chief complaint of abdominal pain.   The patient states that earlier this morning she had sudden onset right lower quadrant abdominal pain that is since now radiated to her bilateral lower abdomen.  She denies any vaginal bleeding or vaginal discharge.  She has not been sexually active and is not concerned for sexually transmitted infections and is not concerned for pregnancy.  She denies any fevers or chills.  She is tolerating oral intake.  She endorses mild nausea.  She does have a history of remote kidney stones and feels that this could be consistent with a prior kidney stone however her pain is now bilateral.  She denies any genitourinary symptoms.  She has no concern for STIs.  Medical Decision Making:   Gerrianne Abbasi is a 22 y.o. female who presented to the ED today with abdominal pain, detailed above.    On arrival, the patient was vitally stable, afebrile, not tachycardic, BP 118/83, saturating 99% on room air.  Sinus rhythm noted on cardiac telemetry. Complete initial physical exam performed, notably the patient  was tender in the bilateral lower abdomen.     Reviewed and confirmed nursing documentation for past medical history, family history, social history.    Initial Assessment:   With the patient's presentation of abdominal pain, most likely diagnosis is ruptured ovarian cyst. Other diagnoses were considered including (but not limited to) gastroenteritis, colitis, small bowel obstruction, appendicitis, cholecystitis, pancreatitis, nephrolithiasis, UTI, pyelonephritis, diverticulitis, ruptured ectopic pregnancy, PID, ovarian torsion. These are considered less likely due to history of present illness and physical exam findings.   This is most consistent with an acute complicated illness   Initial Plan:  CBC/CMP to evaluate for underlying infectious/metabolic etiology for patient's abdominal pain  Lipase to evaluate for pancreatitis  EKG to evaluate for cardiac source of pain  Pelvic US to evaluate for torsion versus  ruptured ovarian cyst Urinalysis and repeat physical assessment to evaluate for UTI/Pyelonpehritis  Empiric management of symptoms with escalating pain control and antiemetics as needed.  If ultrasound negative, will plan for CT of the abdomen pelvis to further evaluate.  Initial Study Results:   Laboratory  All laboratory results reviewed without evidence of clinically relevant pathology.   Exceptions include: None, urine pregnancy negative, UA without evidence of UTI, CBC and CMP reassuring     Radiology All images reviewed independently. Agree with radiology report at this time.   CT ABDOMEN PELVIS W CONTRAST  Result Date: 09/26/2022 CLINICAL DATA:  Bilateral lower abdominal pain that started in the right lower quadrant. Right flank pain. Kidney stone 3 years ago. EXAM: CT ABDOMEN AND PELVIS WITH CONTRAST TECHNIQUE: Multidetector CT imaging of the abdomen and pelvis was performed using the standard protocol following bolus administration of intravenous contrast. RADIATION  DOSE REDUCTION: This exam was performed according to the departmental dose-optimization program which includes automated exposure control, adjustment of the mA and/or kV according to patient size and/or use of iterative reconstruction technique. CONTRAST:  80mL OMNIPAQUE IOHEXOL 350 MG/ML SOLN COMPARISON:  Pelvic ultrasound 09/26/2022 abdominal ultrasound 04/14/2020 FINDINGS: Lower chest: No acute abnormality. Hepatobiliary: Unremarkable liver. Normal gallbladder. No biliary dilation. Pancreas: Unremarkable. Spleen: Unremarkable. Adrenals/Urinary Tract: Normal adrenal glands. No urinary calculi or hydronephrosis. Bladder is unremarkable. Stomach/Bowel: Normal caliber large and small bowel. No bowel wall thickening. The appendix is not visualized. No secondary signs of appendicitis.Stomach is within normal limits. Vascular/Lymphatic: No significant vascular findings are present. No enlarged abdominal or pelvic lymph nodes.  Reproductive: Uterus and left ovary are unremarkable. Involuting corpus luteum cyst in the right ovary measuring 2.7 cm. No follow-up recommended. Small amount of free fluid in the right adnexa. Other: No free intraperitoneal fluid or air. Musculoskeletal: No acute fracture. IMPRESSION: 1. Involuting corpus luteum cyst in the right ovary measuring 2.7 cm. Small amount of free fluid in the right adnexa. Electronically Signed   By: Minerva Fester M.D.   On: 09/26/2022 17:22   US PELVIC TRANSABD W/PELVIC DOPPLER  Result Date: 09/26/2022 CLINICAL DATA:  Pelvic pain EXAM: TRANSABDOMINAL ULTRASOUND OF PELVIS DOPPLER ULTRASOUND OF OVARIES TECHNIQUE: Transabdominal ultrasound examination of the pelvis was performed including evaluation of the uterus, ovaries, adnexal regions, and pelvic cul-de-sac. Color and duplex Doppler ultrasound was utilized to evaluate blood flow to the ovaries. COMPARISON:  Ultrasound 01/31/2022 FINDINGS: Uterus Measurements: 7.8 x 2.9 x 3.7 cm = volume: 42.9 mL. No fibroids or other mass visualized. Endometrium Thickness: 9 mm.  No focal abnormality visualized. Right ovary Measurements: 4.2 x 3.0 x 4.1 cm = volume: 27.0 mL. Normal appearance/no adnexal mass. Left ovary Measurements: 3.7 x 1.6 x 2.4 cm = volume: 7.3 mL. Normal appearance/no adnexal mass. Pulsed Doppler evaluation demonstrates normal low-resistance arterial and venous waveforms in both ovaries. Other: No free fluid in the pelvis. IMPRESSION: Unremarkable transabdominal pelvic ultrasound Electronically Signed   By: Karen Kays M.D.   On: 09/26/2022 15:25       Final Reassessment and Plan:   Ultrasound resulted negative for abnormality in the pelvis with no mass, good blood flow to the ovaries bilaterally.  Plan at time of signout to reassess the patient pending CT imaging to further evaluate for acute intra-abdominal abnormality.  Signout given to Dr. Theresia Lo at [phone].   Final Clinical Impression(s) / ED Diagnoses Final  diagnoses:  RLQ abdominal pain  Luteal cystic ovary disease    Rx / DC Orders ED Discharge Orders     None         Ernie Avena, MD 09/26/22 1752

## 2022-09-26 NOTE — ED Triage Notes (Signed)
Pt arrived POV with sister, c/o pelvic pain stating R flank pain started yesterday but became severe this morning with RLQ abd/pelvic pain. Pt reports hx kidney stone approx 3 yrs ago and states pain is consistent with then. Pt denies fevers, changes in urinary frequency, or hematuria. Pt c/o nausea, denies vomiting. Pt states no chance of pregnancy at present and that she has no concern for STD.

## 2022-09-26 NOTE — ED Provider Notes (Signed)
Patient signed out to me at 1500 by Dr. Karene Fry pending pelvis US. In short this is a 22 year old female with no significant PMH presenting to the ED with R-sided pelvic pain. Does have prior history of kidney stones. She has been hemodynamically stable here. Labs shown negative UA and labs within normal range without leukocytosis. She has received pain and nausea control. If pelvic US negative will need CTAP to evaluate for stone or appendicitis. Upon my evaluation, pelvic US is negative. Will have CTAP. Patient reports pain has improved, has mild suprapubic tenderness on exam. No rebound or guarding, no nausea. Denies any discharge or concern for STD. Reports mild headache from "not eating" and states she is hungry. Informed her she will need to wait to eat until after CT should she have appendicitis or other need for surgery.   Clinical Course as of 09/26/22 1740  Tue Sep 26, 2022  1733 Corpus luteal cyst on CTAP, no other acute abnormality on CT to explain symptoms. [VK]  1737 Patient is well appearing and is stable for discharge home with outpatient follow up. [VK]    Clinical Course User Index [VK] Rexford Maus, DO      Rexford Maus, [place] 09/26/22 1740

## 2022-09-30 DIAGNOSIS — N939 Abnormal uterine and vaginal bleeding, unspecified: Secondary | ICD-10-CM | POA: Diagnosis not present

## 2022-09-30 DIAGNOSIS — E6609 Other obesity due to excess calories: Secondary | ICD-10-CM | POA: Diagnosis not present

## 2022-10-08 DIAGNOSIS — E282 Polycystic ovarian syndrome: Secondary | ICD-10-CM | POA: Diagnosis not present

## 2022-10-08 DIAGNOSIS — N926 Irregular menstruation, unspecified: Secondary | ICD-10-CM | POA: Diagnosis not present

## 2022-10-08 DIAGNOSIS — Z30011 Encounter for initial prescription of contraceptive pills: Secondary | ICD-10-CM | POA: Diagnosis not present

## 2023-01-30 ENCOUNTER — Emergency Department (HOSPITAL_BASED_OUTPATIENT_CLINIC_OR_DEPARTMENT_OTHER)
Admission: EM | Admit: 2023-01-30 | Discharge: 2023-01-30 | Discharge status: Against Medical Advice | Payer: Medicaid Other

## 2023-01-30 ENCOUNTER — Other Ambulatory Visit: Payer: Self-pay

## 2023-01-30 NOTE — ED Notes (Signed)
 Registration says that pt left

## 2023-02-10 ENCOUNTER — Encounter (HOSPITAL_BASED_OUTPATIENT_CLINIC_OR_DEPARTMENT_OTHER): Payer: Self-pay

## 2023-02-10 ENCOUNTER — Other Ambulatory Visit: Payer: Self-pay

## 2023-02-10 ENCOUNTER — Emergency Department (HOSPITAL_BASED_OUTPATIENT_CLINIC_OR_DEPARTMENT_OTHER)
Admission: EM | Admit: 2023-02-10 | Discharge: 2023-02-10 | Disposition: A | Discharge status: Home / Self Care | Payer: Medicaid Other | Attending: Emergency Medicine | Admitting: Emergency Medicine

## 2023-02-10 ENCOUNTER — Emergency Department (HOSPITAL_BASED_OUTPATIENT_CLINIC_OR_DEPARTMENT_OTHER): Payer: Medicaid Other

## 2023-02-10 DIAGNOSIS — Z87442 Personal history of urinary calculi: Secondary | ICD-10-CM | POA: Insufficient documentation

## 2023-02-10 DIAGNOSIS — R1031 Right lower quadrant pain: Secondary | ICD-10-CM | POA: Diagnosis present

## 2023-02-10 DIAGNOSIS — R1032 Left lower quadrant pain: Secondary | ICD-10-CM | POA: Insufficient documentation

## 2023-02-10 DIAGNOSIS — R109 Unspecified abdominal pain: Secondary | ICD-10-CM

## 2023-02-10 LAB — CBC
HCT: 38.2 % (ref 36.0–46.0)
Hemoglobin: 13 g/dL (ref 12.0–15.0)
MCH: 29.3 pg (ref 26.0–34.0)
MCHC: 34 g/dL (ref 30.0–36.0)
MCV: 86.2 fL (ref 80.0–100.0)
Platelets: 308 10*3/uL (ref 150–400)
RBC: 4.43 MIL/uL (ref 3.87–5.11)
RDW: 13 % (ref 11.5–15.5)
WBC: 8.5 10*3/uL (ref 4.0–10.5)
nRBC: 0 % (ref 0.0–0.2)

## 2023-02-10 LAB — COMPREHENSIVE METABOLIC PANEL
ALT: 11 U/L (ref 0–44)
AST: 15 U/L (ref 15–41)
Albumin: 4.4 g/dL (ref 3.5–5.0)
Alkaline Phosphatase: 91 U/L (ref 38–126)
Anion gap: 10 (ref 5–15)
BUN: 19 mg/dL (ref 6–20)
CO2: 24 mmol/L (ref 22–32)
Calcium: 9.5 mg/dL (ref 8.9–10.3)
Chloride: 103 mmol/L (ref 98–111)
Creatinine, Ser: 0.56 mg/dL (ref 0.44–1.00)
GFR, Estimated: >60 mL/min (ref >60)
Glucose, Bld: 94 mg/dL (ref 70–99)
Potassium: 3.6 mmol/L (ref 3.5–5.1)
Sodium: 137 mmol/L (ref 135–145)
Total Bilirubin: 0.3 mg/dL (ref 0.0–1.2)
Total Protein: 7.5 g/dL (ref 6.5–8.1)

## 2023-02-10 LAB — URINALYSIS, ROUTINE W REFLEX MICROSCOPIC
Bilirubin Urine: NEGATIVE
Glucose, UA: NEGATIVE mg/dL
Hgb urine dipstick: NEGATIVE
Leukocytes,Ua: NEGATIVE
Nitrite: NEGATIVE
Protein, ur: NEGATIVE mg/dL
Specific Gravity, Urine: 1.014 (ref 1.005–1.030)
pH: 6 (ref 5.0–8.0)

## 2023-02-10 LAB — LIPASE, BLOOD: Lipase: <10 U/L — ABNORMAL LOW (ref 11–51)

## 2023-02-10 LAB — HCG, SERUM, QUALITATIVE: Preg, Serum: NEGATIVE

## 2023-02-10 MED ORDER — ONDANSETRON HCL 4 MG/2ML IJ SOLN
4.0000 mg | Freq: Once | INTRAMUSCULAR | Status: AC | PRN
  Administered 2023-02-10: 4 mg via INTRAVENOUS
  Filled 2023-02-10: qty 2

## 2023-02-10 MED ORDER — SODIUM CHLORIDE 0.9% FLUSH
3.0000 mL | Freq: Once | INTRAVENOUS | Status: AC
  Administered 2023-02-10: 3 mL via INTRAVENOUS

## 2023-02-10 MED ORDER — KETOROLAC TROMETHAMINE 30 MG/ML IJ SOLN
30.0000 mg | Freq: Once | INTRAMUSCULAR | Status: AC
  Administered 2023-02-10: 30 mg via INTRAVENOUS
  Filled 2023-02-10: qty 1

## 2023-02-10 NOTE — Discharge Instructions (Signed)
Follow-up with primary doctor and return to the ER if symptoms significantly worsen or change. 

## 2023-02-10 NOTE — ED Provider Notes (Signed)
[place] EMERGENCY DEPARTMENT AT [facility] Provider Note   CSN: [id number] Arrival date & time: 02/10/23  0056     History  Chief Complaint  Patient presents with   Abdominal Pain    Pamela Buckley is a 23 y.o. female.  Patient is a 23 year old female with history of kidney stone.  Patient presenting today with complaints of sudden onset lower abdominal pain.  She describes suprapubic pain that started suddenly earlier this evening in the absence of any injury or trauma.  She was very nauseated and had an episode of vomiting.  No fevers or chills.  Her last bowel movement was 4 hours ago and was normal.  Patient speaks little [place] mainly [place]  History taken with the assistance of the patient's sister who was present at bedside and acts as Nurse, learning disability.  The history is provided by the patient.       Home Medications Prior to Admission medications   Medication Sig Start Date End Date Taking? Authorizing Provider  levonorgestrel-ethinyl estradiol (ALESSE) 0.1-20 MG-MCG tablet Take 1 tablet by mouth daily. 01/05/22   Thressa Sheller D, CNM  medroxyPROGESTERone (PROVERA) 10 MG tablet 1 tab po 14d a month. You should get a period. Repeat qmonth. Patient not taking: Reported on 07/22/2021 12/13/20   Cotter Bing, MD  norgestimate-ethinyl estradiol (ORTHO-CYCLEN) 0.25-35 MG-MCG tablet Take 1 tablet by mouth daily. Patient not taking: Reported on 01/05/2022 07/22/21   Marny Lowenstein, PA-C      Allergies    Patient has no known allergies.    Review of Systems   Review of Systems  All other systems reviewed and are negative.   Physical Exam Updated Vital Signs BP 100/63   Pulse 78   Temp (!) 97.4 F (36.3 C)   Resp 16   Ht 5\' 2"  (1.575 m)   Wt 81.2 kg   LMP 12/09/2022   SpO2 99%   BMI 32.74 kg/m  Physical Exam Vitals and nursing note reviewed.  Constitutional:      General: She is not in acute distress.    Appearance: She is well-developed. She is not  diaphoretic.  HENT:     Head: Normocephalic and atraumatic.  Cardiovascular:     Rate and Rhythm: Normal rate and regular rhythm.     Heart sounds: No murmur heard.    No friction rub. No gallop.  Pulmonary:     Effort: Pulmonary effort is normal. No respiratory distress.     Breath sounds: Normal breath sounds. No wheezing.  Abdominal:     General: Bowel sounds are normal. There is no distension.     Palpations: Abdomen is soft.     Tenderness: There is abdominal tenderness in the right lower quadrant, suprapubic area and left lower quadrant. There is no right CVA tenderness, left CVA tenderness, guarding or rebound.     Comments: There is mild tenderness across the lower abdomen.  Musculoskeletal:        General: Normal range of motion.     Cervical back: Normal range of motion and neck supple.  Skin:    General: Skin is warm and dry.  Neurological:     General: No focal deficit present.     Mental Status: She is alert and oriented to person, place, and time.     ED Results / Procedures / Treatments   Labs (all labs ordered are listed, but only abnormal results are displayed) Labs Reviewed  LIPASE, BLOOD - Abnormal; Notable for the  following components:      Result Value   Lipase <10 (*)    All other components within normal limits  COMPREHENSIVE METABOLIC PANEL  CBC  HCG, SERUM, QUALITATIVE  URINALYSIS, ROUTINE W REFLEX MICROSCOPIC    EKG None  Radiology No results found.  Procedures Procedures    Medications Ordered in ED Medications  ketorolac (TORADOL) 30 MG/ML injection 30 mg (has no administration in time range)  sodium chloride flush (NS) 0.9 % injection 3 mL (3 mLs Intravenous Given 02/10/23 0143)  ondansetron (ZOFRAN) injection 4 mg (4 mg Intravenous Given 02/10/23 0143)    ED Course/ Medical Decision Making/ A&P  Patient is a 23 year old female presenting with complaints of lower abdominal pain.  She describes severe pain that started just prior to  arrival.  Patient arrives here quite dramatically, but with stable vital signs and reassuring physical examination.  Laboratory studies obtained including CBC, metabolic panel, urinalysis, and urine pregnancy test, all of which are unremarkable.  CT scan with renal protocol obtained showing no intra-abdominal process.  Patient has received Zofran for nausea and Toradol for pain and is now sleeping.  Pain has resolved and I feel as though patient can safely be discharged.  Cause of the abdominal pain unclear, but nothing appears emergent or surgical.  Final Clinical Impression(s) / ED Diagnoses Final diagnoses:  None    Rx / DC Orders ED Discharge Orders     None         Pamela Lyons, MD 02/10/23 [phone]

## 2023-02-10 NOTE — ED Triage Notes (Signed)
Pt POV from home d/t severe lower Abd pain.  Pt has Nausea but no vomiting.  Pt very lethargic in triage.

## 2023-03-07 ENCOUNTER — Ambulatory Visit: Payer: Medicaid Other | Admitting: Internal Medicine

## 2023-03-07 VITALS — BP 99/68 | HR 78 | Temp 97.5°F | Ht 62.0 in | Wt 169.2 lb

## 2023-03-07 DIAGNOSIS — N926 Irregular menstruation, unspecified: Secondary | ICD-10-CM | POA: Diagnosis present

## 2023-03-07 NOTE — Patient Instructions (Signed)
Thank you, [name] for allowing [place] to provide your care today. Today we discussed:  Irregular menstrual cycles You have had extensive lab work and imaging to see what is the cause of your irregular cycles, but all of the labs have been normal You can call your OBGYN at [phone] to get another appointment with them to discuss this further Weight loss can help to make your periods more normal -- keep up with your diet and exercise  I have ordered the following labs for you:  Lab Orders  No laboratory test(s) ordered today      Referrals ordered today:   Referral Orders  No referral(s) requested today     I have ordered the following medication/changed the following medications:   Stop the following medications: There are no discontinued medications.   Start the following medications: No orders of the defined types were placed in this encounter.    Follow up: 1 year    Should you have any questions or concerns please call the internal medicine clinic at [phone].     Pamela Buckley, D.O. [facility]

## 2023-03-07 NOTE — Progress Notes (Signed)
CC: routine visit  HPI:  Pamela Buckley is a 23 y.o. female living with a history stated below and presents today for rregular menses Please see problem based assessment and plan for additional details.  Past Medical History:  Diagnosis Date   COVID-19 08/2020   Kidney stones     Current Outpatient Medications on File Prior to Visit  Medication Sig Dispense Refill   levonorgestrel-ethinyl estradiol (ALESSE) 0.1-20 MG-MCG tablet Take 1 tablet by mouth daily. 28 tablet 11   medroxyPROGESTERone (PROVERA) 10 MG tablet 1 tab po 14d a month. You should get a period. Repeat qmonth. (Patient not taking: Reported on 07/22/2021) 42 tablet 3   norgestimate-ethinyl estradiol (ORTHO-CYCLEN) 0.25-35 MG-MCG tablet Take 1 tablet by mouth daily. (Patient not taking: Reported on 01/05/2022) 28 tablet 11   No current facility-administered medications on file prior to visit.    Family History  Problem Relation Age of Onset   Migraines Mother    Obesity Father    Autoimmune disease Neg Hx    CAD Neg Hx    Cancer Neg Hx    Diabetes Neg Hx     Social History   Socioeconomic History   Marital status: Single    Spouse name: Not on file   Number of children: Not on file   Years of education: Not on file   Highest education level: Not on file  Occupational History   Not on file  Tobacco Use   Smoking status: Never   Smokeless tobacco: Never  Vaping Use   Vaping status: Never Used  Substance and Sexual Activity   Alcohol use: Never   Drug use: Never   Sexual activity: Not Currently  Other Topics Concern   Not on file  Social History Narrative   Not on file   Social Drivers of Health   Financial Resource Strain: Not on file  Food Insecurity: Not on file  Transportation Needs: Not on file  Physical Activity: Not on file  Stress: Not on file  Social Connections: Not on file  Intimate Partner Violence: Not on file    Review of Systems: ROS negative except for what is noted on  the assessment and plan.  Vitals:   03/07/23 0914 03/07/23 0921  BP: 99/68 99/68  Pulse: 78 78  Temp: (!) 97.5 F (36.4 C) (!) 97.5 F (36.4 C)  TempSrc: Oral Oral  SpO2: 98% 98%  Weight: 169 lb 3.2 oz (76.7 kg) 169 lb 3.2 oz (76.7 kg)  Height: 5\' 2"  (1.575 m) 5\' 2"  (1.575 m)    Physical Exam: Constitutional: well-appearing, young female sitting in chair, in no acute distress Cardiovascular: regular rate and rhythm, no m/r/g Pulmonary/Chest: normal work of breathing on room air Abdominal: soft, non-tender, non-distended Skin: warm and dry Psych: normal mood and behavior  Assessment & Plan:   Patient discussed with Dr. Cleda Daub  Irregular menses The patient presents for follow-up of her irregular menses.  She states that her cycles occur every 3 to 4 months and last 3 to 4 days with light vaginal bleeding.  She has been on oral contraceptives in the past, and they did not improve the regularity of her cycles.  She has been seen by [facility] and the [facility] for woman's health care for this chief complaint.  Patient has had an extensive workup for this, including normal thyroid labs, normal prolactin, normal DHEA, and a normal pelvic ultrasound.  She does not have any hirsutism, thus  does not meet the criteria for PCOS.  The patient is not sexually active, nor does she have a history of any gynecologic surgeries.  She states that her cycle started to become irregular when she started gaining weight.  She has been doing her best to cut down on carbs, and is exercising about 5 times per week (doing cardio and weights), has not had much success with weight loss.  We discussed how weight gain can often cause amenorrhea.  Plan: -Counseled on continued lifestyle modifications - Provided with phone number for OB/GYN that she has seen previously   Whitney Hillegass, D.O. [facility] Internal Medicine, PGY-3 Phone: [phone] Date 03/07/2023 Time 9:51 AM

## 2023-03-07 NOTE — Assessment & Plan Note (Addendum)
The patient presents for follow-up of her irregular menses.  She states that her cycles occur every 3 to 4 months and last 3 to 4 days with light vaginal bleeding.  She has been on oral contraceptives in the past, and they did not improve the regularity of her cycles.  She has been seen by [facility] and the [facility] for woman's health care for this chief complaint.  Patient has had an extensive workup for this, including normal thyroid labs, normal prolactin, normal DHEA, and a normal pelvic ultrasound.  She does not have any hirsutism, thus does not meet the criteria for PCOS.  The patient is not sexually active, nor does she have a history of any gynecologic surgeries.  She states that her cycle started to become irregular when she started gaining weight.  She has been doing her best to cut down on carbs, and is exercising about 5 times per week (doing cardio and weights), has not had much success with weight loss.  We discussed how weight gain can often cause amenorrhea.  Plan: -Counseled on continued lifestyle modifications - Provided with phone number for OB/GYN that she has seen previously

## 2023-03-08 ENCOUNTER — Emergency Department (HOSPITAL_BASED_OUTPATIENT_CLINIC_OR_DEPARTMENT_OTHER)
Admission: EM | Admit: 2023-03-08 | Discharge: 2023-03-08 | Disposition: A | Discharge status: Home / Self Care | Payer: Medicaid Other

## 2023-03-08 ENCOUNTER — Other Ambulatory Visit: Payer: Self-pay

## 2023-03-08 ENCOUNTER — Encounter (HOSPITAL_BASED_OUTPATIENT_CLINIC_OR_DEPARTMENT_OTHER): Payer: Self-pay

## 2023-03-08 DIAGNOSIS — R112 Nausea with vomiting, unspecified: Secondary | ICD-10-CM | POA: Insufficient documentation

## 2023-03-08 DIAGNOSIS — R519 Headache, unspecified: Secondary | ICD-10-CM | POA: Diagnosis present

## 2023-03-08 DIAGNOSIS — Z20822 Contact with and (suspected) exposure to covid-19: Secondary | ICD-10-CM | POA: Diagnosis not present

## 2023-03-08 LAB — CBC
HCT: 39.4 % (ref 36.0–46.0)
Hemoglobin: 13.3 g/dL (ref 12.0–15.0)
MCH: 29.7 pg (ref 26.0–34.0)
MCHC: 33.8 g/dL (ref 30.0–36.0)
MCV: 87.9 fL (ref 80.0–100.0)
Platelets: 283 10*3/uL (ref 150–400)
RBC: 4.48 MIL/uL (ref 3.87–5.11)
RDW: 13 % (ref 11.5–15.5)
WBC: 7.6 10*3/uL (ref 4.0–10.5)
nRBC: 0 % (ref 0.0–0.2)

## 2023-03-08 LAB — COMPREHENSIVE METABOLIC PANEL
ALT: 13 U/L (ref 0–44)
AST: 18 U/L (ref 15–41)
Albumin: 4.2 g/dL (ref 3.5–5.0)
Alkaline Phosphatase: 87 U/L (ref 38–126)
Anion gap: 8 (ref 5–15)
BUN: 11 mg/dL (ref 6–20)
CO2: 27 mmol/L (ref 22–32)
Calcium: 9.8 mg/dL (ref 8.9–10.3)
Chloride: 103 mmol/L (ref 98–111)
Creatinine, Ser: 0.6 mg/dL (ref 0.44–1.00)
GFR, Estimated: >60 mL/min (ref >60)
Glucose, Bld: 108 mg/dL — ABNORMAL HIGH (ref 70–99)
Potassium: 4 mmol/L (ref 3.5–5.1)
Sodium: 138 mmol/L (ref 135–145)
Total Bilirubin: 0.5 mg/dL (ref 0.0–1.2)
Total Protein: 7.4 g/dL (ref 6.5–8.1)

## 2023-03-08 LAB — LIPASE, BLOOD: Lipase: <10 U/L — ABNORMAL LOW (ref 11–51)

## 2023-03-08 LAB — HCG, SERUM, QUALITATIVE: Preg, Serum: NEGATIVE

## 2023-03-08 LAB — RESP PANEL BY RT-PCR (RSV, FLU A&B, COVID)  RVPGX2
Influenza A by PCR: NEGATIVE
Influenza B by PCR: NEGATIVE
Resp Syncytial Virus by PCR: NEGATIVE
SARS Coronavirus 2 by RT PCR: NEGATIVE

## 2023-03-08 MED ORDER — ONDANSETRON HCL 4 MG/2ML IJ SOLN
4.0000 mg | Freq: Once | INTRAMUSCULAR | Status: AC | PRN
  Administered 2023-03-08: 4 mg via INTRAVENOUS
  Filled 2023-03-08: qty 2

## 2023-03-08 MED ORDER — SODIUM CHLORIDE 0.9 % IV BOLUS
1000.0000 mL | Freq: Once | INTRAVENOUS | Status: AC
  Administered 2023-03-08: 1000 mL via INTRAVENOUS

## 2023-03-08 MED ORDER — KETOROLAC TROMETHAMINE 15 MG/ML IJ SOLN
15.0000 mg | Freq: Once | INTRAMUSCULAR | Status: AC
  Administered 2023-03-08: 15 mg via INTRAVENOUS
  Filled 2023-03-08: qty 1

## 2023-03-08 MED ORDER — DIPHENHYDRAMINE HCL 50 MG/ML IJ SOLN
25.0000 mg | Freq: Once | INTRAMUSCULAR | Status: AC
  Administered 2023-03-08: 25 mg via INTRAVENOUS
  Filled 2023-03-08: qty 1

## 2023-03-08 MED ORDER — METOCLOPRAMIDE HCL 10 MG PO TABS: 10.0000 mg | ORAL_TABLET | Freq: Four times a day (QID) | ORAL | 0 refills | Status: DC

## 2023-03-08 MED ORDER — PROCHLORPERAZINE EDISYLATE 10 MG/2ML IJ SOLN
10.0000 mg | Freq: Once | INTRAMUSCULAR | Status: AC
  Administered 2023-03-08: 10 mg via INTRAVENOUS
  Filled 2023-03-08: qty 2

## 2023-03-08 NOTE — ED Provider Notes (Signed)
[place] EMERGENCY DEPARTMENT AT [facility] Provider Note   CSN: [id number] Arrival date & time: 03/08/23  7846     History  Chief Complaint  Patient presents with   Emesis    Pamela Buckley is a 23 y.o. female.  23 year old female present emergency department for headache.  Reports history of the same roughly 2 times per month.  This headache seems slightly worse.  Right side of head.  No vision changes, no numbness tingling changes in sensation.  Had some nausea and an episode of vomiting.  She did stay up till 2 AM studying prior to symptom onset.   Emesis      Home Medications Prior to Admission medications   Medication Sig Start Date End Date Taking? Authorizing Provider  levonorgestrel-ethinyl estradiol (ALESSE) 0.1-20 MG-MCG tablet Take 1 tablet by mouth daily. 01/05/22   Thressa Sheller D, CNM  medroxyPROGESTERone (PROVERA) 10 MG tablet 1 tab po 14d a month. You should get a period. Repeat qmonth. Patient not taking: Reported on 07/22/2021 12/13/20   [place] Bing, MD  norgestimate-ethinyl estradiol (ORTHO-CYCLEN) 0.25-35 MG-MCG tablet Take 1 tablet by mouth daily. Patient not taking: Reported on 01/05/2022 07/22/21   Marny Lowenstein, PA-C      Allergies    Patient has no known allergies.    Review of Systems   Review of Systems  Gastrointestinal:  Positive for vomiting.    Physical Exam Updated Vital Signs BP 127/84   Pulse 70   Temp 98.3 F (36.8 C) (Oral)   Resp 20   Ht 5\' 2"  (1.575 m)   Wt 76.7 kg   LMP 03/08/2023   SpO2 99%   BMI 30.95 kg/m  Physical Exam Vitals and nursing note reviewed.  HENT:     Head: Normocephalic.     Mouth/Throat:     Mouth: Mucous membranes are moist.  Eyes:     Conjunctiva/sclera: Conjunctivae normal.     Pupils: Pupils are equal, round, and reactive to light.  Cardiovascular:     Rate and Rhythm: Normal rate and regular rhythm.  Pulmonary:     Effort: Pulmonary effort is normal.     Breath sounds:  Normal breath sounds.  Abdominal:     General: Abdomen is flat. There is no distension.     Tenderness: There is no abdominal tenderness. There is no guarding or rebound.  Musculoskeletal:        General: Normal range of motion.     Cervical back: Normal range of motion and neck supple.  Skin:    General: Skin is warm and dry.     Capillary Refill: Capillary refill takes less than 2 seconds.  Neurological:     Mental Status: She is alert and oriented to person, place, and time.  Psychiatric:        Mood and Affect: Mood normal.        Behavior: Behavior normal.     ED Results / Procedures / Treatments   Labs (all labs ordered are listed, but only abnormal results are displayed) Labs Reviewed  LIPASE, BLOOD - Abnormal; Notable for the following components:      Result Value   Lipase <10 (*)    All other components within normal limits  COMPREHENSIVE METABOLIC PANEL - Abnormal; Notable for the following components:   Glucose, Bld 108 (*)    All other components within normal limits  RESP PANEL BY RT-PCR (RSV, FLU A&B, COVID)  RVPGX2  CBC  HCG, SERUM,  QUALITATIVE  URINALYSIS, ROUTINE W REFLEX MICROSCOPIC  PREGNANCY, URINE    EKG None  Radiology No results found.  Procedures Procedures    Medications Ordered in ED Medications  ondansetron (ZOFRAN) injection 4 mg (4 mg Intravenous Given 03/08/23 0636)  sodium chloride 0.9 % bolus 1,000 mL (1,000 mLs Intravenous New Bag/Given 03/08/23 0755)  ketorolac (TORADOL) 15 MG/ML injection 15 mg (15 mg Intravenous Given 03/08/23 0747)  prochlorperazine (COMPAZINE) injection 10 mg (10 mg Intravenous Given 03/08/23 0747)  diphenhydrAMINE (BENADRYL) injection 25 mg (25 mg Intravenous Given 03/08/23 2130)    ED Course/ Medical Decision Making/ A&P Clinical Course as of 03/08/23 0910  Thu Mar 08, 2023  0714 Ct renal 02/10/23: 'IMPRESSION: 1. No acute intra-abdominal pathology identified. No definite radiographic explanation for the  patient's reported symptoms." [TY]  0714 CBC No leukocytosis to suggest stomach infection.  No anemia [TY]  0714 Comprehensive metabolic panel(!) No significant metabolic derangements.  Normal kidney function.  No transaminitis to suggest hepatobiliary disease. [TY]  0715 Lipase(!): <10 Lipase not suggestive of pancreatitis. [TY]  0715 Preg, Serum: NEGATIVE Ectopic pregnancy less likely [TY]    Clinical Course User Index [TY] [name], DO                                 Medical Decision Making Is a well-appearing 23 year old female presenting emergency department for headache.  She is afebrile nontachycardic hemodynamically stable.  Has a normal neuroexam.  Moving neck freely.  Low suspicion for acute intracranial pathology.  Low suspicion for meningitis as she has no signs of meningeal irritation on exam.  Did have some nausea vomiting.  Per chart review has been seen recently for abdominal pain had negative scans.  Lab work reassuring as noted in ED course.  Viral etiology and differential diagnosis therefore flu/COVID/RSV ordered and was negative.  She was given Compazine, Toradol and IV fluids with improvement of her headache.  Stable for discharge at this time.  Amount and/or Complexity of Data Reviewed Labs: ordered. Decision-making details documented in ED Course.  Risk Prescription drug management.         Final Clinical Impression(s) / ED Diagnoses Final diagnoses:  None    Rx / DC Orders ED Discharge Orders     None         [name], DO 03/08/23 8657

## 2023-03-08 NOTE — Discharge Instructions (Signed)
He may take over-the-counter Tylenol or ibuprofen for headaches.  Please follow-up with your primary doctor as there are some medications that she can take to prevent headaches.  Return immediately if develop worsening symptoms, sudden onset headache, vision changes, unilateral weakness, seizures, chest pain, shortness of breath inability to eat or drink due to nausea vomiting, abdominal pain or, please return if you develop any new or worsening symptoms that are concerning to you.

## 2023-03-08 NOTE — ED Triage Notes (Signed)
Pt POV with mother and sister d/t N/V/D with headache since 0300 this morning.  Pt states she is very weak.  Seen here recently for same.

## 2023-03-08 NOTE — ED Notes (Signed)
Pt had reaction to compazine, called RN and she felt like she couldn't catch her breath . RN did adm compazine iv over 3 minutes. Heart rate 95 and her resting was 64. Explained reaction to pt and her sister, provided emotional support. Informed MD and 25 iv benadryl adm with resolution of symptoms.

## 2023-03-08 NOTE — ED Notes (Signed)
Dc instructions reviewed with patient. Patient voiced understanding. Dc with belongings. Asking for prescription for nausea medicine, MD informed

## 2023-03-12 NOTE — Progress Notes (Signed)
 Internal Medicine Clinic Attending  Case discussed with the resident at the time of the visit.  We reviewed the resident's history and exam and pertinent patient test results.  I agree with the assessment, diagnosis, and plan of care documented in the resident's note.

## 2023-07-13 ENCOUNTER — Encounter: Payer: Self-pay | Admitting: *Deleted

## 2023-09-07 ENCOUNTER — Encounter

## 2023-09-07 ENCOUNTER — Ambulatory Visit

## 2023-09-07 VITALS — BP 111/78 | HR 82 | Temp 98.5°F | Ht 62.0 in | Wt 179.4 lb

## 2023-09-07 DIAGNOSIS — R635 Abnormal weight gain: Secondary | ICD-10-CM

## 2023-09-07 DIAGNOSIS — R5383 Other fatigue: Secondary | ICD-10-CM | POA: Diagnosis not present

## 2023-09-07 DIAGNOSIS — E65 Localized adiposity: Secondary | ICD-10-CM | POA: Diagnosis not present

## 2023-09-07 DIAGNOSIS — R432 Parageusia: Secondary | ICD-10-CM | POA: Diagnosis not present

## 2023-09-07 MED ORDER — OMEPRAZOLE 20 MG PO CPDR
20.0000 mg | DELAYED_RELEASE_CAPSULE | Freq: Every day | ORAL | 1 refills | Status: AC
Start: 2023-09-07 — End: ?

## 2023-09-07 NOTE — Assessment & Plan Note (Signed)
 Weight Gain 40 pounds in past year. Patient states that she eat about one meal a day and does not report snacking. Was taking birth control for irregular menses and maintaining good weight for her; stopped birth control in 2022/2023 and started gaining weight. Period now regular after 6 months off of hormonal birth control, LMP August 20, 2023. Previously worked up for PCOS; patient currently denies facial hair, acne, or irregular periods. Last CT Abdomen in ED for abdominal pain did not note ovarian cysts. Goes on walks daily.  Within past year has also noticed axillary fat pad growth, earlier in year, they were symmetrical, but now the left appears larger than the right side. On physical exam: Axillary Fat pad bilaterally. Left side slightly larger than right side. Soft, uniform feel, no firmness present. Lymph nodes not palpable. Non-tender.  Considerations for weight gain: hypothyroidism (fatigue + weight gain + Hair changes, last TSH 2023 WNL), PCOS (does not meet criteria), Diet (patient reports eating 1 meal a day + no snacking + exercising daily with walks), hormonal, and other endocrine causes.   Plan:  -TSH ordered -If TSH WNL, further endocrine workup with possible endocrinology referral.  -encouraged patient to eat healthy and continue exercising in meantime.

## 2023-09-07 NOTE — Progress Notes (Signed)
 CC: bad taste in mouth and increase fat/skin hanging around armpits   HPI:  [name] is a 23 y.o. female with pertinent past medical history of irregular menses, H. Pylori infection, dyspepsia (further medical history stated below) and presents today for abdominal pain. Please see problem based assessment and plan for additional details.   Last Pertinent Labs Documented:     Latest Ref Rng & Units 03/08/2023    6:33 AM 02/10/2023    1:16 AM 09/26/2022    9:40 AM  BMP  Glucose 70 - 99 mg/dL 891  94  896   BUN 6 - 20 mg/dL 11  19  11    Creatinine 0.44 - 1.00 mg/dL 9.39  9.43  9.50   Sodium 135 - 145 mmol/L 138  137  136   Potassium 3.5 - 5.1 mmol/L 4.0  3.6  3.7   Chloride 98 - 111 mmol/L 103  103  105   CO2 22 - 32 mmol/L 27  24  23    Calcium 8.9 - 10.3 mg/dL 9.8  9.5  8.7        Latest Ref Rng & Units 03/08/2023    6:33 AM 02/10/2023    1:16 AM 09/26/2022    9:40 AM  CBC  WBC 4.0 - 10.5 K/uL 7.6  8.5  7.0   Hemoglobin 12.0 - 15.0 g/dL 86.6  86.9  87.2   Hematocrit 36.0 - 46.0 % 39.4  38.2  38.0   Platelets 150 - 400 K/uL 283  308  324     Lab Results  Component Value Date   HGBA1C 5.5 12/13/2020     Past Medical History:  Diagnosis Date   Acute vaginitis 03/18/2020   COVID-19 08/2020   Dyspepsia 03/17/2021   Helicobacter pylori infection 03/18/2020   History of scabies 07/02/2020   Irregular menses 03/18/2020   Kidney stones    RUQ pain 02/19/2020    Current Outpatient Medications on File Prior to Visit  Medication Sig Dispense Refill   levonorgestrel -ethinyl estradiol  (ALESSE) 0.1-20 MG-MCG tablet Take 1 tablet by mouth daily. 28 tablet 11   medroxyPROGESTERone  (PROVERA ) 10 MG tablet 1 tab po 14d a month. You should get a period. Repeat qmonth. (Patient not taking: Reported on 07/22/2021) 42 tablet 3   metoCLOPramide  (REGLAN ) 10 MG tablet Take 1 tablet (10 mg total) by mouth every 6 (six) hours. 8 tablet 0   norgestimate -ethinyl estradiol  (ORTHO-CYCLEN)  0.25-35 MG-MCG tablet Take 1 tablet by mouth daily. (Patient not taking: Reported on 01/05/2022) 28 tablet 11   No current facility-administered medications on file prior to visit.    Family History  Problem Relation Age of Onset   Migraines Mother    Obesity Father    Autoimmune disease Neg Hx    CAD Neg Hx    Cancer Neg Hx    Diabetes Neg Hx     Social History   Socioeconomic History   Marital status: Single    Spouse name: Not on file   Number of children: Not on file   Years of education: Not on file   Highest education level: Not on file  Occupational History   Not on file  Tobacco Use   Smoking status: Never   Smokeless tobacco: Never  Vaping Use   Vaping status: Never Used  Substance and Sexual Activity   Alcohol use: Never   Drug use: Never   Sexual activity: Not Currently  Other Topics Concern   Not on  file  Social History Narrative   Not on file   Social Drivers of Health   Financial Resource Strain: Not on file  Food Insecurity: Not on file  Transportation Needs: Not on file  Physical Activity: Not on file  Stress: Not on file  Social Connections: Not on file  Intimate Partner Violence: Not on file    Review of Systems: Review of Systems  Constitutional:  Positive for malaise/fatigue. Negative for chills and fever.  Respiratory:  Negative for shortness of breath.   Cardiovascular:  Negative for chest pain.  Gastrointestinal:  Negative for abdominal pain, blood in stool, constipation, diarrhea, heartburn, melena, nausea and vomiting.  Skin:        Dry skin on back and sees dermatologist  Neurological:  Negative for dizziness and headaches.  Psychiatric/Behavioral:  Negative for depression and substance abuse.      Vitals:   09/07/23 0811  BP: 111/78  Pulse: 82  Temp: 98.5 F (36.9 C)  TempSrc: Oral  SpO2: 95%  Weight: 179 lb 6.4 oz (81.4 kg)  Height: 5' 2 (1.575 m)    Physical Exam: Physical Exam HENT:     Mouth/Throat:      Mouth: Mucous membranes are moist.     Pharynx: Oropharynx is clear. No oropharyngeal exudate or posterior oropharyngeal erythema.  Cardiovascular:     Rate and Rhythm: Normal rate and regular rhythm.     Heart sounds: No murmur heard.    No gallop.  Pulmonary:     Breath sounds: Normal breath sounds. No stridor. No wheezing, rhonchi or rales.  Abdominal:     General: Abdomen is protuberant. Bowel sounds are normal. There is no distension.     Palpations: Abdomen is soft.     Tenderness: There is no abdominal tenderness. There is no guarding or rebound.  Skin:    General: Skin is warm and dry.     Comments: Axillary Fat pad bilaterally. Left side slightly larger than right side. Soft, uniform feel, no firmness present. Lymph nodes not palpable. Non-tender  Patch of dry skin on lower back near intergluteal cleft. Biopsy taken by dermatology.  Neurological:     Mental Status: She is alert.     Deep Tendon Reflexes:     Reflex Scores:      Patellar reflexes are 2+ on the right side and 2+ on the left side.    Studying dentistry and in community college. Graduating December 2025   Assessment & Plan:   Patient seen with Dr. Trudy Assessment & Plan Weight gain Axillary fat pad Weight Gain 40 pounds in past year. Patient states that she eat about one meal a day and does not report snacking. Was taking birth control for irregular menses and maintaining good weight for her; stopped birth control in 2022/2023 and started gaining weight. Period now regular after 6 months off of hormonal birth control, LMP August 20, 2023. Previously worked up for PCOS; patient currently denies facial hair, acne, or irregular periods. Last CT Abdomen in ED for abdominal pain did not note ovarian cysts. Goes on walks daily.  Within past year has also noticed axillary fat pad growth, earlier in year, they were symmetrical, but now the left appears larger than the right side. On physical exam: Axillary Fat pad  bilaterally. Left side slightly larger than right side. Soft, uniform feel, no firmness present. Lymph nodes not palpable. Non-tender.  Considerations for weight gain: hypothyroidism (fatigue + weight gain + Hair changes, last TSH  2023 WNL), PCOS (does not meet criteria), Diet (patient reports eating 1 meal a day + no snacking + exercising daily with walks), hormonal, and other endocrine causes.   Plan:  -TSH ordered -If TSH WNL, further endocrine workup with possible endocrinology referral.  -encouraged patient to eat healthy and continue exercising in meantime.  Fatigue, unspecified type Reports increasing fatigue for the past year. Denies depression/sadness. Light and regular menstruations. Reports hair falling out, denies skin changes. 2+ patellar reflexes. 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  -TSH ordered (see weight gain) Dysgeusia Onset 6 months ago. When she wakes up in morning it tastes a little spicy, taste is bad, not sour or metallic. No abdominal pain currently. Does not take any medications like tums or PPI. No burning sensation in throat, acid reflux, abdominal pain, or hoarseness upon waking. Patient thought eating late at night was contributing so she stops eating around 7-8. Will go to bed around 2. After dinner, however, she will often lay down until going to bed to watch TV or relax. States she does not lay completely flat during these times.  On physical exam, mouth was moist and clear, no film on tongue, no exudate present. Teeth and gums in excellent condition. No tenderness to palpation of abdomen, no tenderness to epigastric area. Last dentist appointment 8 months ago.  Previous appointment in 2023 Was placed on omeprazole  for dyspepsia, patient does not remember this.  High suspicion for silent reflux with history of dyspepsia and H.pylori. Patient not currently having abdominal pain, no need to test for h. Pylori at this time.  Will try omeprazole  20 mg for 1 month and f/u.   Plan -recommended sitting up for a couple of hours after eating to see if that helps with taste -omeprazole  20 mg ordered -f/u 1 month   Orders Placed This Encounter  Procedures   TSH     Abdoul Encinas, D.O. [facility], PGY-1 Date 09/07/2023 Time 10:13 AM

## 2023-09-07 NOTE — Assessment & Plan Note (Signed)
 Reports increasing fatigue for the past year. Denies depression/sadness. Light and regular menstruations. Reports hair falling out, denies skin changes. 2+ patellar reflexes. 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  -TSH ordered (see weight gain)

## 2023-09-07 NOTE — Assessment & Plan Note (Signed)
 Onset 6 months ago. When she wakes up in morning it tastes a little spicy, taste is bad, not sour or metallic. No abdominal pain currently. Does not take any medications like tums or PPI. No burning sensation in throat, acid reflux, abdominal pain, or hoarseness upon waking. Patient thought eating late at night was contributing so she stops eating around 7-8. Will go to bed around 2. After dinner, however, she will often lay down until going to bed to watch TV or relax. States she does not lay completely flat during these times.  On physical exam, mouth was moist and clear, no film on tongue, no exudate present. Teeth and gums in excellent condition. No tenderness to palpation of abdomen, no tenderness to epigastric area. Last dentist appointment 8 months ago.  Previous appointment in 2023 Was placed on omeprazole  for dyspepsia, patient does not remember this.  High suspicion for silent reflux with history of dyspepsia and H.pylori. Patient not currently having abdominal pain, no need to test for h. Pylori at this time. Will try omeprazole  20 mg for 1 month and f/u.   Plan -recommended sitting up for a couple of hours after eating to see if that helps with taste -omeprazole  20 mg ordered -f/u 1 month

## 2023-09-07 NOTE — Patient Instructions (Addendum)
 Today we discussed the following medical conditions and plan:   Weight Gain/Fatigue/Extra Skin -continue exercising daily and eating healthy -try sitting up after you eat dinner for a couple of hours to allow your food to go down with gravity and hopefully help with the silent reflux.  -I have ordered TSH to check your thyroid , I will call/message you with the results.   Bad Taste in Mouth  -I will order Omeprazole  20 mg, take this daily until we see you back in 1 month. This should help with the silent reflux if this is causing your symptoms.    We look forward to seeing you next time. Please call our clinic at [phone] if you have any questions or concerns. The best time to call is Monday-Friday from 9am-4pm, but there is someone available 24/7. If you need medication refills, please notify your pharmacy one week in advance and they will send us  a request.   Thank you for trusting me with your care. Wishing you the best!   Sallyanne Primas, DO  [facility]

## 2023-09-08 LAB — TSH: TSH: 4.19 u[IU]/mL (ref 0.450–4.500)

## 2023-09-10 ENCOUNTER — Ambulatory Visit: Payer: Self-pay

## 2023-09-10 NOTE — Progress Notes (Signed)
Internal Medicine Clinic Attending  I was physically present during the key portions of the resident provided service and participated in the medical decision making of patient's management care. I reviewed pertinent patient test results.  The assessment, diagnosis, and plan were formulated together and I agree with the documentation in the resident's note.  Williams, Julie Anne, MD
